# Patient Record
Sex: Male | Born: 1945 | Race: Black or African American | Hispanic: No | Marital: Married | Smoking: Never smoker
Health system: Southern US, Community
[De-identification: ages and names within clinical notes are randomized; demographics above are authoritative.]

## PROBLEM LIST (undated history)

## (undated) DIAGNOSIS — K1120 Sialoadenitis, unspecified: Secondary | ICD-10-CM

## (undated) DIAGNOSIS — R030 Elevated blood-pressure reading, without diagnosis of hypertension: Secondary | ICD-10-CM

## (undated) DIAGNOSIS — K2270 Barrett's esophagus without dysplasia: Secondary | ICD-10-CM

## (undated) DIAGNOSIS — I889 Nonspecific lymphadenitis, unspecified: Secondary | ICD-10-CM

## (undated) DIAGNOSIS — M129 Arthropathy, unspecified: Secondary | ICD-10-CM

## (undated) DIAGNOSIS — R718 Other abnormality of red blood cells: Secondary | ICD-10-CM

## (undated) DIAGNOSIS — E781 Pure hyperglyceridemia: Secondary | ICD-10-CM

## (undated) DIAGNOSIS — A048 Other specified bacterial intestinal infections: Secondary | ICD-10-CM

## (undated) DIAGNOSIS — I10 Essential (primary) hypertension: Secondary | ICD-10-CM

## (undated) DIAGNOSIS — M8580 Other specified disorders of bone density and structure, unspecified site: Secondary | ICD-10-CM

## (undated) DIAGNOSIS — R809 Proteinuria, unspecified: Secondary | ICD-10-CM

## (undated) DIAGNOSIS — R42 Dizziness and giddiness: Secondary | ICD-10-CM

## (undated) DIAGNOSIS — N183 Chronic kidney disease, stage 3 unspecified: Secondary | ICD-10-CM

## (undated) DIAGNOSIS — M1990 Unspecified osteoarthritis, unspecified site: Secondary | ICD-10-CM

## (undated) DIAGNOSIS — K317 Polyp of stomach and duodenum: Secondary | ICD-10-CM

## (undated) DIAGNOSIS — D649 Anemia, unspecified: Secondary | ICD-10-CM

## (undated) DIAGNOSIS — C61 Malignant neoplasm of prostate: Secondary | ICD-10-CM

## (undated) DIAGNOSIS — R822 Biliuria: Secondary | ICD-10-CM

## (undated) DIAGNOSIS — R972 Elevated prostate specific antigen [PSA]: Secondary | ICD-10-CM

## (undated) DIAGNOSIS — R011 Cardiac murmur, unspecified: Secondary | ICD-10-CM

## (undated) DIAGNOSIS — E611 Iron deficiency: Secondary | ICD-10-CM

## (undated) HISTORY — DX: Other specified disorders of bone density and structure, unspecified site: M85.80

## (undated) HISTORY — DX: Other abnormality of red blood cells: R71.8

## (undated) HISTORY — DX: Barrett's esophagus without dysplasia: K22.70

## (undated) HISTORY — DX: Iron deficiency: E61.1

## (undated) HISTORY — DX: Other specified bacterial intestinal infections: A04.8

## (undated) HISTORY — DX: Dizziness and giddiness: R42

## (undated) HISTORY — DX: Elevated blood-pressure reading, without diagnosis of hypertension: R03.0

## (undated) HISTORY — DX: Pure hyperglyceridemia: E78.1

## (undated) HISTORY — DX: Anemia, unspecified: D64.9

## (undated) HISTORY — PX: PROSTATE BIOPSY: SHX241

## (undated) HISTORY — DX: Sialoadenitis, unspecified: K11.20

## (undated) HISTORY — DX: Arthropathy, unspecified: M12.9

## (undated) HISTORY — DX: Nonspecific lymphadenitis, unspecified: I88.9

## (undated) HISTORY — DX: Polyp of stomach and duodenum: K31.7

## (undated) HISTORY — DX: Chronic kidney disease, stage 3 unspecified: N18.30

---

## 1998-07-06 HISTORY — PX: INGUINAL HERNIA REPAIR: SHX194

## 2007-05-01 ENCOUNTER — Emergency Department (HOSPITAL_COMMUNITY): Admission: EM | Admit: 2007-05-01 | Discharge: 2007-05-01 | Payer: Self-pay | Admitting: Emergency Medicine

## 2007-05-04 ENCOUNTER — Emergency Department (HOSPITAL_COMMUNITY): Admission: EM | Admit: 2007-05-04 | Discharge: 2007-05-04 | Payer: Self-pay | Admitting: Emergency Medicine

## 2018-06-22 ENCOUNTER — Ambulatory Visit: Payer: Self-pay | Admitting: Oncology

## 2018-07-05 ENCOUNTER — Encounter: Payer: Self-pay | Admitting: Radiation Oncology

## 2018-07-05 NOTE — Progress Notes (Addendum)
GU Location of Tumor / Histology: prostatic adenocarcinoma  If Prostate Cancer, Gleason Score is (3 + 3) and PSA is (18). Prostate volume: 50.6  Nathan Castro reports he was initially told his PSA was elevated three years ago. Patient requested Dr. Harlow Asa refer him to Dr. Tammi Klippel for treatment so he could be here with his wife who was also diagnosed with cancer in November. His wife is receiving treatment here at [facility].   Biopsies of prostate (if applicable) revealed:    Past/Anticipated interventions by urology, if any: prostate biopsy and referral for consideration of radiotherapy.  Past/Anticipated interventions by medical oncology, if any: no  Weight changes, if any: no  Bowel/Bladder complaints, if any: SHIM 5 without Viagra. IPSS 8. Denies dysuria, hematuria, urinary leakage or incontinence.    Nausea/Vomiting, if any: no  Pain issues, if any:  no  SAFETY ISSUES:  Prior radiation? no  Pacemaker/ICD? no  Possible current pregnancy? no  Is the patient on methotrexate? no  Current Complaints / other details:  72 year old male. Married. Self referral. Brother had prostate biopsy recently that was negative. Sister has breast ca. Another brother has colon ca.

## 2018-07-07 ENCOUNTER — Telehealth: Payer: Self-pay | Admitting: *Deleted

## 2018-07-07 ENCOUNTER — Ambulatory Visit
Admission: RE | Admit: 2018-07-07 | Discharge: 2018-07-07 | Disposition: A | Discharge status: Home / Self Care | Payer: Medicare Other | Source: Ambulatory Visit | Attending: Radiation Oncology | Admitting: Radiation Oncology

## 2018-07-07 ENCOUNTER — Encounter: Payer: Self-pay | Admitting: Radiation Oncology

## 2018-07-07 ENCOUNTER — Other Ambulatory Visit: Payer: Self-pay

## 2018-07-07 ENCOUNTER — Encounter: Payer: Self-pay | Admitting: Medical Oncology

## 2018-07-07 VITALS — BP 176/71 | HR 66 | Temp 97.7°F | Resp 18 | Ht 72.0 in | Wt 181.4 lb

## 2018-07-07 DIAGNOSIS — C61 Malignant neoplasm of prostate: Secondary | ICD-10-CM | POA: Insufficient documentation

## 2018-07-07 DIAGNOSIS — Z79899 Other long term (current) drug therapy: Secondary | ICD-10-CM | POA: Diagnosis not present

## 2018-07-07 DIAGNOSIS — I10 Essential (primary) hypertension: Secondary | ICD-10-CM | POA: Diagnosis not present

## 2018-07-07 DIAGNOSIS — M069 Rheumatoid arthritis, unspecified: Secondary | ICD-10-CM | POA: Diagnosis not present

## 2018-07-07 HISTORY — DX: Malignant neoplasm of prostate: C61

## 2018-07-07 HISTORY — DX: Essential (primary) hypertension: I10

## 2018-07-07 HISTORY — DX: Cardiac murmur, unspecified: R01.1

## 2018-07-07 HISTORY — DX: Biliuria: R82.2

## 2018-07-07 HISTORY — DX: Proteinuria, unspecified: R80.9

## 2018-07-07 HISTORY — DX: Unspecified osteoarthritis, unspecified site: M19.90

## 2018-07-07 HISTORY — DX: Elevated prostate specific antigen (PSA): R97.20

## 2018-07-07 NOTE — Progress Notes (Signed)
Introduced myself to Mr. Bogdanski as the prostate nurse navigator and my role. He states he was seen by urology in [facility] and he self referred himself to [facility] because his wife is being treated here for breast cancer. He had done a lot of research on prostate cancer treatments and is most interested in brachytherapy. He has a small volume of disease but a PSA of 18. He does not want to continue active surveillance. He is aware he will need a referral to [facility] for a urologist that will be able to assist Dr. Tammi Klippel with the seed implant. He was instructed to call us with questions or concerns.

## 2018-07-07 NOTE — Progress Notes (Signed)
See progress note under physician encounter. 

## 2018-07-07 NOTE — Progress Notes (Signed)
Radiation Oncology         [phone] ________________________________  Initial outpatient Consultation  Name: Nathan Castro MRN: [id number]  Date: 07/07/2018  DOB: 01/24/1946  WU:[id number], Lupita Raider, PA  Penni Bombard, PA   REFERRING PHYSICIAN: Penni Bombard, [place]  DIAGNOSIS: 73 y.o. gentleman with Stage T1c adenocarcinoma of the prostate with Gleason score of 3+3, and PSA of 18.    ICD-10-CM   1. Malignant neoplasm of prostate ([place]) C61     HISTORY OF PRESENT ILLNESS: Nathan Castro is a 73 y.o. male with a diagnosis of prostate cancer. He was noted to have an elevated PSA of 16.65 by his primary care physician's assistant, Hubbard Robinson.  Accordingly, he was referred for evaluation in urology by Dr. Burnell Blanks on 05/16/18,  digital rectal examination was performed at that time revealing no nodule.  The patient proceeded to transrectal ultrasound with 12 biopsies of the prostate on 05/31/18.  The prostate volume measured 50.6 cc.  Out of 12 core biopsies, one was positive.  The maximum Gleason score was 3+3, and this was seen in the right medial base.  The patient reviewed the biopsy results with his urologist and he has kindly been referred today for discussion of potential radiation treatment options.   PREVIOUS RADIATION THERAPY: No  PAST MEDICAL HISTORY:  Past Medical History:  Diagnosis Date  . Arthritis    rheumatoid arthritis  . Bilirubinuria   . Elevated PSA   . Heart murmur   . Hypertension   . Prostate cancer ([place])   . Proteinuria       PAST SURGICAL HISTORY: Past Surgical History:  Procedure Laterality Date  . INGUINAL HERNIA REPAIR  2000  . PROSTATE BIOPSY      FAMILY HISTORY:  Family History  Problem Relation Age of Onset  . Breast cancer Sister   . Colon cancer Brother   . Prostate cancer Neg Hx   . Pancreatic cancer Neg Hx     SOCIAL HISTORY:  Social History   Socioeconomic History  . Marital status: Married    Spouse name: Not on file  .  Number of children: 2  . Years of education: Not on file  . Highest education level: Not on file  Occupational History    Comment: retired  Scientific laboratory technician  . Financial resource strain: Not on file  . Food insecurity:    Worry: Not on file    Inability: Not on file  . Transportation needs:    Medical: Not on file    Non-medical: Not on file  Tobacco Use  . Smoking status: Never Smoker  . Smokeless tobacco: Never Used  Substance and Sexual Activity  . Alcohol use: Never    Frequency: Never  . Drug use: Never  . Sexual activity: Yes  Lifestyle  . Physical activity:    Days per week: Not on file    Minutes per session: Not on file  . Stress: Not on file  Relationships  . Social connections:    Talks on phone: Not on file    Gets together: Not on file    Attends religious service: Not on file    Active member of club or organization: Not on file    Attends meetings of clubs or organizations: Not on file    Relationship status: Not on file  . Intimate partner violence:    Fear of current or ex partner: Not on file    Emotionally abused: Not on file  Physically abused: Not on file    Forced sexual activity: Not on file  Other Topics Concern  . Not on file  Social History Narrative   Resides in [place] with wife.     ALLERGIES: Patient has no known allergies.  MEDICATIONS:  Current Outpatient Medications  Medication Sig Dispense Refill  . amLODipine-benazepril (LOTREL) 10-40 MG capsule Take 1 capsule by mouth daily.    . Cholecalciferol (VITAMIN D3) 1.25 MG (50000 UT) CAPS Take by mouth.    . ferrous sulfate 325 (65 FE) MG tablet Take 325 mg by mouth daily with breakfast.    . Garlic 4742 MG CAPS Take by mouth.    . Omega-3 Fatty Acids (FISH OIL) 1000 MG CAPS Take by mouth.    . sildenafil (VIAGRA) 100 MG tablet Take 100 mg by mouth daily as needed for erectile dysfunction.     No current facility-administered medications for this encounter.     REVIEW OF  SYSTEMS:  On review of systems, the patient reports that he is doing well overall. He denies any chest pain, shortness of breath, cough, fevers, chills, night sweats, unintended weight changes. He denies any bowel disturbances, and denies abdominal pain, nausea or vomiting. He denies any new musculoskeletal or joint aches or pains. His IPSS was 8, indicating mild urinary symptoms.  A complete review of systems is obtained and is otherwise negative.    PHYSICAL EXAM:  Wt Readings from Last 3 Encounters:  07/07/18 181 lb 6.4 oz (82.3 kg)   Temp Readings from Last 3 Encounters:  07/07/18 97.7 F (36.5 C) (Oral)   BP Readings from Last 3 Encounters:  07/07/18 (!) 176/71   Pulse Readings from Last 3 Encounters:  07/07/18 66   Pain Assessment Pain Score: 0-No pain/10  In general this is a well appearing man in no acute distress. He is alert and oriented x4 and appropriate throughout the examination. HEENT reveals that the patient is normocephalic, atraumatic. EOMs are intact. PERRLA. Skin is intact without any evidence of gross lesions. Cardiovascular exam reveals a regular rate and rhythm, no clicks rubs or murmurs are auscultated. Chest is clear to auscultation bilaterally. Lymphatic assessment is performed and does not reveal any adenopathy in the cervical, supraclavicular, axillary, or inguinal chains. Abdomen has active bowel sounds in all quadrants and is intact. The abdomen is soft, non tender, non distended. Lower extremities are negative for pretibial pitting edema, deep calf tenderness, cyanosis or clubbing.   KPS = 100  100 - Normal; no complaints; no evidence of disease. 90   - Able to carry on normal activity; minor signs or symptoms of disease. 80   - Normal activity with effort; some signs or symptoms of disease. 10   - Cares for self; unable to carry on normal activity or to do active work. 60   - Requires occasional assistance, but is able to care for most of his personal  needs. 50   - Requires considerable assistance and frequent medical care. 60   - Disabled; requires special care and assistance. 13   - Severely disabled; hospital admission is indicated although death not imminent. 77   - Very sick; hospital admission necessary; active supportive treatment necessary. 10   - Moribund; fatal processes progressing rapidly. 0     - Dead  Karnofsky DA, Abelmann WH, Craver LS and Burchenal [facility] [phone]) The use of the nitrogen mustards in the palliative treatment of carcinoma: with particular reference to bronchogenic carcinoma Cancer 1 634-56  LABORATORY DATA:  No results found for: WBC, HGB, HCT, MCV, PLT No results found for: NA, K, CL, CO2 No results found for: ALT, AST, GGT, ALKPHOS, BILITOT   RADIOGRAPHY: No results found.    IMPRESSION/PLAN: 1. 73 y.o. gentleman with Stage T1c adenocarcinoma of the prostate with Gleason Score of 3+3, and PSA of 18. We discussed the patient's workup and outlines the nature of prostate cancer in this setting. The patient's T stage, Gleason's score, and PSA put him into the intermediate risk group. Accordingly, he is eligible for a variety of potential treatment options including brachytherapy, 5.5 weeks of external radiation or 5 weeks of external radiation followed by a brachytherapy boost. We discussed the available radiation techniques, and focused on the details and logistics and delivery.  We discussed and outlined the risks, benefits, short and long-term effects associated with radiotherapy and compared and contrasted these with prostatectomy. We discussed the role of SpaceOAR in reducing the rectal toxicity associated with radiotherapy.   At the end of the conversation the patient is interested in moving forward with prostate brachytherapy.  In order to consider brachytherapy, we will refer the patient to [facility] for consultation.  Of course, if they decide on another option, such as prostatectomy or surveillance,  I am supportive of other recommendations based on their expertise.  With a PSA of 18, we also discussed staging studies like bone scan and or CT scan.  The small volume of Gleason 6 disease would not otherwise warrant additional staging.  I am also interested in [facility] opinion about interpreting these somewhat disparate clinical characteristics.  We spent 60 minutes face to face with the patient and more than 50% of that time was spent in counseling and/or coordination of care.    Nicholos Johns, PA-C    Tyler Pita, MD  [facility] Direct Dial: [phone]  Fax: [phone] [email or web address]  Skype  LinkedIn

## 2018-07-07 NOTE — Telephone Encounter (Signed)
Called patient to inform of [place] appt. with Dr. Alinda Money on 07-19-18 - arrival time - 3:45 pm, lvm for a return call

## 2018-07-14 ENCOUNTER — Encounter: Payer: Self-pay | Admitting: General Practice

## 2018-07-14 NOTE — Progress Notes (Signed)
[place] Psychosocial Distress Screening Clinical Social Work  Clinical Social Work was referred by distress screening protocol.  The patient scored a 5 on the Psychosocial Distress Thermometer which indicates moderate distress. Clinical Social Worker contacted patient by phone to assess for distress and other psychosocial needs. Unable to reach patient at home or mobile number.  Left VM w information on [place], contact information and encouragement to call as needed for support/resources.  ONCBCN DISTRESS SCREENING 07/07/2018  Screening Type Initial Screening  Distress experienced in past week (1-10) 5  Practical problem type Housing  Family Problem type Partner  Emotional problem type Adjusting to illness  Information Concerns Type Lack of info about treatment  Physical Problem type Sexual problems;Skin dry/itchy  Physician notified of physical symptoms Yes  Referral to clinical psychology No  Referral to clinical social work Yes  Referral to dietition No  Referral to financial advocate No  Referral to support programs No  Referral to palliative care No  Other call any time i am retired    Holiday representative follow up needed: No.  If yes, follow up plan:  Beverely Pace, [place], LCSW Clinical Social Worker Phone:  [phone]

## 2018-07-18 ENCOUNTER — Telehealth: Payer: Self-pay | Admitting: Radiation Oncology

## 2018-07-18 NOTE — Telephone Encounter (Signed)
Phoned patient's cell and home phone to further inquire about inbasket sent. No answer at either number. Able to left message only on home phone. Awaiting return call.

## 2018-07-18 NOTE — Telephone Encounter (Signed)
-----   Message from Gardiner Rhyme, RN sent at 07/18/2018 10:03 AM EST ----- Regarding: Patient needs to clarify his treatment plan Jen Mow, I spoke to Mr. Gloria in infusion today.  He does not want a seed implant after discussing with family and he wants external radiation only.  He has an appointment today with urology that he wants to see if he can cancel.  He also wants to know how soon he can be scheduled for external radiation as well.  His phone number at home is [phone] and his cell is [phone]. Thanks! Threasa Beards

## 2018-08-05 ENCOUNTER — Other Ambulatory Visit: Payer: Self-pay | Admitting: Urology

## 2018-08-05 DIAGNOSIS — C61 Malignant neoplasm of prostate: Secondary | ICD-10-CM

## 2018-08-08 ENCOUNTER — Telehealth: Payer: Self-pay | Admitting: *Deleted

## 2018-08-08 NOTE — Telephone Encounter (Signed)
Called patient to inform of sim appt. for 08-19-18 - arrival time- 1:45 pm @ [facility] and his MRI on 08-19-18 - arrival time - 3:30 pm @ [facility], no restrictions to test , spoke with patient and he is aware of these appts.

## 2018-08-11 ENCOUNTER — Encounter: Payer: Self-pay | Admitting: Urology

## 2018-08-11 NOTE — Progress Notes (Signed)
Patient is scheduled for fiducial markers and SpaceOAR gel placement in the office with Dr. Alinda Money 08/16/18.  CT SIM is scheduled for 08/19/18.

## 2018-08-18 ENCOUNTER — Telehealth: Payer: Self-pay | Admitting: *Deleted

## 2018-08-18 NOTE — Telephone Encounter (Signed)
CALLED PATIENT TO REMIND OF SIM APPT. FOR 08-19-18 @ 2 PM @ [facility] AND HIS MRI FOR 08-19-18 - ARRIVAL TIME- 3:30 PM @ [facility], NO RESTRICTIONS TO TEST, SPOKE WITH PATIENT AND HE IS AWARE OF THESE APPTS.

## 2018-08-19 ENCOUNTER — Ambulatory Visit
Admission: RE | Admit: 2018-08-19 | Discharge: 2018-08-19 | Disposition: A | Discharge status: Home / Self Care | Payer: Medicare Other | Source: Ambulatory Visit | Attending: Radiation Oncology | Admitting: Radiation Oncology

## 2018-08-19 ENCOUNTER — Ambulatory Visit (HOSPITAL_COMMUNITY)
Admission: RE | Admit: 2018-08-19 | Discharge: 2018-08-19 | Disposition: A | Discharge status: Home / Self Care | Payer: Medicare Other | Source: Ambulatory Visit | Attending: Urology | Admitting: Urology

## 2018-08-19 ENCOUNTER — Encounter: Payer: Self-pay | Admitting: Medical Oncology

## 2018-08-19 DIAGNOSIS — C61 Malignant neoplasm of prostate: Secondary | ICD-10-CM | POA: Insufficient documentation

## 2018-08-19 DIAGNOSIS — Z79899 Other long term (current) drug therapy: Secondary | ICD-10-CM | POA: Insufficient documentation

## 2018-08-19 DIAGNOSIS — I10 Essential (primary) hypertension: Secondary | ICD-10-CM | POA: Diagnosis not present

## 2018-08-19 DIAGNOSIS — M069 Rheumatoid arthritis, unspecified: Secondary | ICD-10-CM | POA: Insufficient documentation

## 2018-08-19 NOTE — Progress Notes (Signed)
  Radiation Oncology         [phone] ________________________________  Name: Nathan Castro MRN: [id number]  Date: 08/19/2018  DOB: 21-May-1946  SIMULATION AND TREATMENT PLANNING NOTE    ICD-10-CM   1. Malignant neoplasm of prostate ([place]) C61     DIAGNOSIS:  73 y.o. gentleman with Stage T1c adenocarcinoma of the prostate with Gleason score of 3+3, and PSA of 18.  NARRATIVE:  The patient was brought to the [facility].  Identity was confirmed.  All relevant records and images related to the planned course of therapy were reviewed.  The patient freely provided informed written consent to proceed with treatment after reviewing the details related to the planned course of therapy. The consent form was witnessed and verified by the simulation staff.  Then, the patient was set-up in a stable reproducible supine position for radiation therapy.  A vacuum lock pillow device was custom fabricated to position his legs in a reproducible immobilized position.  Then, I performed a urethrogram under sterile conditions to identify the prostatic apex.  CT images were obtained.  Surface markings were placed.  The CT images were loaded into the planning software.  Then the prostate target and avoidance structures including the rectum, bladder, bowel and hips were contoured.  Treatment planning then occurred.  The radiation prescription was entered and confirmed.  A total of one complex treatment devices was fabricated. I have requested : Intensity Modulated Radiotherapy (IMRT) is medically necessary for this case for the following reason:  Rectal sparing.  PLAN:  The patient will receive 70 Gy in 28 fractions.  ________________________________  Sheral Apley Tammi Klippel, M.D.  This document serves as a record of services personally performed by Tyler Pita, MD. It was created on his behalf by Wilburn Mylar, a trained medical scribe. The creation of this record is based on the scribe's personal  observations and the provider's statements to them. This document has been checked and approved by the attending provider.

## 2018-08-26 DIAGNOSIS — C61 Malignant neoplasm of prostate: Secondary | ICD-10-CM | POA: Diagnosis not present

## 2018-08-31 ENCOUNTER — Ambulatory Visit
Admission: RE | Admit: 2018-08-31 | Discharge: 2018-08-31 | Disposition: A | Discharge status: Home / Self Care | Payer: Medicare Other | Source: Ambulatory Visit | Attending: Radiation Oncology | Admitting: Radiation Oncology

## 2018-08-31 DIAGNOSIS — C61 Malignant neoplasm of prostate: Secondary | ICD-10-CM

## 2018-09-01 ENCOUNTER — Ambulatory Visit
Admission: RE | Admit: 2018-09-01 | Discharge: 2018-09-01 | Disposition: A | Discharge status: Home / Self Care | Payer: Medicare Other | Source: Ambulatory Visit | Attending: Radiation Oncology | Admitting: Radiation Oncology

## 2018-09-01 DIAGNOSIS — C61 Malignant neoplasm of prostate: Secondary | ICD-10-CM | POA: Diagnosis not present

## 2018-09-02 ENCOUNTER — Ambulatory Visit
Admission: RE | Admit: 2018-09-02 | Discharge: 2018-09-02 | Disposition: A | Discharge status: Home / Self Care | Payer: Medicare Other | Source: Ambulatory Visit | Attending: Radiation Oncology | Admitting: Radiation Oncology

## 2018-09-02 DIAGNOSIS — C61 Malignant neoplasm of prostate: Secondary | ICD-10-CM | POA: Diagnosis not present

## 2018-09-05 ENCOUNTER — Ambulatory Visit
Admission: RE | Admit: 2018-09-05 | Discharge: 2018-09-05 | Disposition: A | Discharge status: Home / Self Care | Payer: Medicare Other | Source: Ambulatory Visit | Attending: Radiation Oncology | Admitting: Radiation Oncology

## 2018-09-05 DIAGNOSIS — Z79899 Other long term (current) drug therapy: Secondary | ICD-10-CM | POA: Insufficient documentation

## 2018-09-05 DIAGNOSIS — M069 Rheumatoid arthritis, unspecified: Secondary | ICD-10-CM | POA: Insufficient documentation

## 2018-09-05 DIAGNOSIS — C61 Malignant neoplasm of prostate: Secondary | ICD-10-CM | POA: Insufficient documentation

## 2018-09-05 DIAGNOSIS — I10 Essential (primary) hypertension: Secondary | ICD-10-CM | POA: Diagnosis not present

## 2018-09-06 ENCOUNTER — Ambulatory Visit
Admission: RE | Admit: 2018-09-06 | Discharge: 2018-09-06 | Disposition: A | Discharge status: Home / Self Care | Payer: Medicare Other | Source: Ambulatory Visit | Attending: Radiation Oncology | Admitting: Radiation Oncology

## 2018-09-06 DIAGNOSIS — C61 Malignant neoplasm of prostate: Secondary | ICD-10-CM | POA: Diagnosis not present

## 2018-09-07 ENCOUNTER — Ambulatory Visit
Admission: RE | Admit: 2018-09-07 | Discharge: 2018-09-07 | Disposition: A | Discharge status: Home / Self Care | Payer: Medicare Other | Source: Ambulatory Visit | Attending: Radiation Oncology | Admitting: Radiation Oncology

## 2018-09-07 DIAGNOSIS — C61 Malignant neoplasm of prostate: Secondary | ICD-10-CM | POA: Diagnosis not present

## 2018-09-08 ENCOUNTER — Ambulatory Visit
Admission: RE | Admit: 2018-09-08 | Discharge: 2018-09-08 | Disposition: A | Discharge status: Home / Self Care | Payer: Medicare Other | Source: Ambulatory Visit | Attending: Radiation Oncology | Admitting: Radiation Oncology

## 2018-09-08 DIAGNOSIS — C61 Malignant neoplasm of prostate: Secondary | ICD-10-CM | POA: Diagnosis not present

## 2018-09-09 ENCOUNTER — Ambulatory Visit
Admission: RE | Admit: 2018-09-09 | Discharge: 2018-09-09 | Disposition: A | Discharge status: Home / Self Care | Payer: Medicare Other | Source: Ambulatory Visit | Attending: Radiation Oncology | Admitting: Radiation Oncology

## 2018-09-09 DIAGNOSIS — C61 Malignant neoplasm of prostate: Secondary | ICD-10-CM | POA: Diagnosis not present

## 2018-09-12 ENCOUNTER — Ambulatory Visit
Admission: RE | Admit: 2018-09-12 | Discharge: 2018-09-12 | Disposition: A | Discharge status: Home / Self Care | Payer: Medicare Other | Source: Ambulatory Visit | Attending: Radiation Oncology | Admitting: Radiation Oncology

## 2018-09-12 DIAGNOSIS — C61 Malignant neoplasm of prostate: Secondary | ICD-10-CM | POA: Diagnosis not present

## 2018-09-13 ENCOUNTER — Ambulatory Visit
Admission: RE | Admit: 2018-09-13 | Discharge: 2018-09-13 | Disposition: A | Discharge status: Home / Self Care | Payer: Medicare Other | Source: Ambulatory Visit | Attending: Radiation Oncology | Admitting: Radiation Oncology

## 2018-09-13 DIAGNOSIS — C61 Malignant neoplasm of prostate: Secondary | ICD-10-CM | POA: Diagnosis not present

## 2018-09-14 ENCOUNTER — Ambulatory Visit
Admission: RE | Admit: 2018-09-14 | Discharge: 2018-09-14 | Disposition: A | Discharge status: Home / Self Care | Payer: Medicare Other | Source: Ambulatory Visit | Attending: Radiation Oncology | Admitting: Radiation Oncology

## 2018-09-14 ENCOUNTER — Other Ambulatory Visit: Payer: Self-pay

## 2018-09-14 DIAGNOSIS — C61 Malignant neoplasm of prostate: Secondary | ICD-10-CM | POA: Diagnosis not present

## 2018-09-15 ENCOUNTER — Ambulatory Visit
Admission: RE | Admit: 2018-09-15 | Discharge: 2018-09-15 | Disposition: A | Discharge status: Home / Self Care | Payer: Medicare Other | Source: Ambulatory Visit | Attending: Radiation Oncology | Admitting: Radiation Oncology

## 2018-09-15 ENCOUNTER — Other Ambulatory Visit: Payer: Self-pay

## 2018-09-15 DIAGNOSIS — C61 Malignant neoplasm of prostate: Secondary | ICD-10-CM | POA: Diagnosis not present

## 2018-09-16 ENCOUNTER — Other Ambulatory Visit: Payer: Self-pay

## 2018-09-16 ENCOUNTER — Ambulatory Visit
Admission: RE | Admit: 2018-09-16 | Discharge: 2018-09-16 | Disposition: A | Discharge status: Home / Self Care | Payer: Medicare Other | Source: Ambulatory Visit | Attending: Radiation Oncology | Admitting: Radiation Oncology

## 2018-09-16 DIAGNOSIS — C61 Malignant neoplasm of prostate: Secondary | ICD-10-CM | POA: Diagnosis not present

## 2018-09-19 ENCOUNTER — Ambulatory Visit
Admission: RE | Admit: 2018-09-19 | Discharge: 2018-09-19 | Disposition: A | Discharge status: Home / Self Care | Payer: Medicare Other | Source: Ambulatory Visit | Attending: Radiation Oncology | Admitting: Radiation Oncology

## 2018-09-19 ENCOUNTER — Other Ambulatory Visit: Payer: Self-pay

## 2018-09-19 DIAGNOSIS — C61 Malignant neoplasm of prostate: Secondary | ICD-10-CM | POA: Diagnosis not present

## 2018-09-20 ENCOUNTER — Ambulatory Visit
Admission: RE | Admit: 2018-09-20 | Discharge: 2018-09-20 | Disposition: A | Discharge status: Home / Self Care | Payer: Medicare Other | Source: Ambulatory Visit | Attending: Radiation Oncology | Admitting: Radiation Oncology

## 2018-09-20 ENCOUNTER — Other Ambulatory Visit: Payer: Self-pay

## 2018-09-20 DIAGNOSIS — C61 Malignant neoplasm of prostate: Secondary | ICD-10-CM | POA: Diagnosis not present

## 2018-09-21 ENCOUNTER — Other Ambulatory Visit: Payer: Self-pay

## 2018-09-21 ENCOUNTER — Ambulatory Visit
Admission: RE | Admit: 2018-09-21 | Discharge: 2018-09-21 | Disposition: A | Discharge status: Home / Self Care | Payer: Medicare Other | Source: Ambulatory Visit | Attending: Radiation Oncology | Admitting: Radiation Oncology

## 2018-09-21 DIAGNOSIS — C61 Malignant neoplasm of prostate: Secondary | ICD-10-CM | POA: Diagnosis not present

## 2018-09-22 ENCOUNTER — Other Ambulatory Visit: Payer: Self-pay

## 2018-09-22 ENCOUNTER — Ambulatory Visit
Admission: RE | Admit: 2018-09-22 | Discharge: 2018-09-22 | Disposition: A | Discharge status: Home / Self Care | Payer: Medicare Other | Source: Ambulatory Visit | Attending: Radiation Oncology | Admitting: Radiation Oncology

## 2018-09-22 DIAGNOSIS — C61 Malignant neoplasm of prostate: Secondary | ICD-10-CM | POA: Diagnosis not present

## 2018-09-23 ENCOUNTER — Other Ambulatory Visit: Payer: Self-pay | Admitting: Radiation Oncology

## 2018-09-23 ENCOUNTER — Other Ambulatory Visit: Payer: Self-pay

## 2018-09-23 ENCOUNTER — Ambulatory Visit
Admission: RE | Admit: 2018-09-23 | Discharge: 2018-09-23 | Disposition: A | Discharge status: Home / Self Care | Payer: Medicare Other | Source: Ambulatory Visit | Attending: Radiation Oncology | Admitting: Radiation Oncology

## 2018-09-23 DIAGNOSIS — C61 Malignant neoplasm of prostate: Secondary | ICD-10-CM | POA: Diagnosis not present

## 2018-09-23 MED ORDER — TAMSULOSIN HCL 0.4 MG PO CAPS: 0.4000 mg | ORAL_CAPSULE | Freq: Every day | ORAL | 0 refills | Status: DC

## 2018-09-23 MED ORDER — PHENAZOPYRIDINE HCL 100 MG PO TABS: 100.0000 mg | ORAL_TABLET | Freq: Three times a day (TID) | ORAL | 0 refills | Status: DC | PRN

## 2018-09-26 ENCOUNTER — Other Ambulatory Visit: Payer: Self-pay

## 2018-09-26 ENCOUNTER — Ambulatory Visit
Admission: RE | Admit: 2018-09-26 | Discharge: 2018-09-26 | Disposition: A | Discharge status: Home / Self Care | Payer: Medicare Other | Source: Ambulatory Visit | Attending: Radiation Oncology | Admitting: Radiation Oncology

## 2018-09-26 DIAGNOSIS — C61 Malignant neoplasm of prostate: Secondary | ICD-10-CM | POA: Diagnosis not present

## 2018-09-27 ENCOUNTER — Other Ambulatory Visit: Payer: Self-pay

## 2018-09-27 ENCOUNTER — Ambulatory Visit
Admission: RE | Admit: 2018-09-27 | Discharge: 2018-09-27 | Disposition: A | Discharge status: Home / Self Care | Payer: Medicare Other | Source: Ambulatory Visit | Attending: Radiation Oncology | Admitting: Radiation Oncology

## 2018-09-27 DIAGNOSIS — C61 Malignant neoplasm of prostate: Secondary | ICD-10-CM | POA: Diagnosis not present

## 2018-09-28 ENCOUNTER — Ambulatory Visit
Admission: RE | Admit: 2018-09-28 | Discharge: 2018-09-28 | Disposition: A | Discharge status: Home / Self Care | Payer: Medicare Other | Source: Ambulatory Visit | Attending: Radiation Oncology | Admitting: Radiation Oncology

## 2018-09-28 ENCOUNTER — Other Ambulatory Visit: Payer: Self-pay

## 2018-09-28 DIAGNOSIS — C61 Malignant neoplasm of prostate: Secondary | ICD-10-CM | POA: Diagnosis not present

## 2018-09-29 ENCOUNTER — Ambulatory Visit
Admission: RE | Admit: 2018-09-29 | Discharge: 2018-09-29 | Disposition: A | Discharge status: Home / Self Care | Payer: Medicare Other | Source: Ambulatory Visit | Attending: Radiation Oncology | Admitting: Radiation Oncology

## 2018-09-29 ENCOUNTER — Other Ambulatory Visit: Payer: Self-pay

## 2018-09-29 DIAGNOSIS — C61 Malignant neoplasm of prostate: Secondary | ICD-10-CM | POA: Diagnosis not present

## 2018-09-30 ENCOUNTER — Other Ambulatory Visit: Payer: Self-pay

## 2018-09-30 ENCOUNTER — Ambulatory Visit
Admission: RE | Admit: 2018-09-30 | Discharge: 2018-09-30 | Disposition: A | Discharge status: Home / Self Care | Payer: Medicare Other | Source: Ambulatory Visit | Attending: Radiation Oncology | Admitting: Radiation Oncology

## 2018-09-30 ENCOUNTER — Other Ambulatory Visit: Payer: Self-pay | Admitting: Radiation Oncology

## 2018-09-30 DIAGNOSIS — C61 Malignant neoplasm of prostate: Secondary | ICD-10-CM | POA: Diagnosis not present

## 2018-09-30 MED ORDER — PHENAZOPYRIDINE HCL 100 MG PO TABS: 100.0000 mg | ORAL_TABLET | Freq: Three times a day (TID) | ORAL | 0 refills | Status: DC | PRN

## 2018-10-03 ENCOUNTER — Ambulatory Visit
Admission: RE | Admit: 2018-10-03 | Discharge: 2018-10-03 | Disposition: A | Discharge status: Home / Self Care | Payer: Medicare Other | Source: Ambulatory Visit | Attending: Radiation Oncology | Admitting: Radiation Oncology

## 2018-10-03 ENCOUNTER — Other Ambulatory Visit: Payer: Self-pay

## 2018-10-03 DIAGNOSIS — C61 Malignant neoplasm of prostate: Secondary | ICD-10-CM | POA: Diagnosis not present

## 2018-10-04 ENCOUNTER — Ambulatory Visit
Admission: RE | Admit: 2018-10-04 | Discharge: 2018-10-04 | Disposition: A | Discharge status: Home / Self Care | Payer: Medicare Other | Source: Ambulatory Visit | Attending: Radiation Oncology | Admitting: Radiation Oncology

## 2018-10-04 ENCOUNTER — Other Ambulatory Visit: Payer: Self-pay

## 2018-10-04 DIAGNOSIS — C61 Malignant neoplasm of prostate: Secondary | ICD-10-CM | POA: Diagnosis not present

## 2018-10-05 ENCOUNTER — Other Ambulatory Visit: Payer: Self-pay

## 2018-10-05 ENCOUNTER — Ambulatory Visit
Admission: RE | Admit: 2018-10-05 | Discharge: 2018-10-05 | Disposition: A | Discharge status: Home / Self Care | Payer: Medicare Other | Source: Ambulatory Visit | Attending: Radiation Oncology | Admitting: Radiation Oncology

## 2018-10-05 DIAGNOSIS — I10 Essential (primary) hypertension: Secondary | ICD-10-CM | POA: Diagnosis not present

## 2018-10-05 DIAGNOSIS — M069 Rheumatoid arthritis, unspecified: Secondary | ICD-10-CM | POA: Diagnosis not present

## 2018-10-05 DIAGNOSIS — Z79899 Other long term (current) drug therapy: Secondary | ICD-10-CM | POA: Diagnosis not present

## 2018-10-05 DIAGNOSIS — C61 Malignant neoplasm of prostate: Secondary | ICD-10-CM | POA: Insufficient documentation

## 2018-10-06 ENCOUNTER — Other Ambulatory Visit: Payer: Self-pay

## 2018-10-06 ENCOUNTER — Ambulatory Visit
Admission: RE | Admit: 2018-10-06 | Discharge: 2018-10-06 | Disposition: A | Discharge status: Home / Self Care | Payer: Medicare Other | Source: Ambulatory Visit | Attending: Radiation Oncology | Admitting: Radiation Oncology

## 2018-10-06 DIAGNOSIS — C61 Malignant neoplasm of prostate: Secondary | ICD-10-CM | POA: Diagnosis not present

## 2018-10-07 ENCOUNTER — Encounter: Payer: Self-pay | Admitting: Medical Oncology

## 2018-10-07 ENCOUNTER — Other Ambulatory Visit: Payer: Self-pay | Admitting: Radiation Oncology

## 2018-10-07 ENCOUNTER — Ambulatory Visit
Admission: RE | Admit: 2018-10-07 | Discharge: 2018-10-07 | Disposition: A | Discharge status: Home / Self Care | Payer: Medicare Other | Source: Ambulatory Visit | Attending: Radiation Oncology | Admitting: Radiation Oncology

## 2018-10-07 ENCOUNTER — Other Ambulatory Visit: Payer: Self-pay

## 2018-10-07 DIAGNOSIS — C61 Malignant neoplasm of prostate: Secondary | ICD-10-CM | POA: Diagnosis not present

## 2018-10-07 MED ORDER — AMLODIPINE BESY-BENAZEPRIL HCL 10-40 MG PO CAPS: 1.0000 | ORAL_CAPSULE | Freq: Every day | ORAL | 5 refills | Status: DC

## 2018-10-14 ENCOUNTER — Encounter: Payer: Self-pay | Admitting: Radiation Oncology

## 2018-10-14 NOTE — Progress Notes (Signed)
  Radiation Oncology         [phone] ________________________________  Name: Nathan Castro MRN: [id number]  Date: 10/14/2018  DOB: 1945/11/24  End of Treatment Note  Diagnosis:   73 y.o. gentleman with Stage T1c adenocarcinoma of the prostate with Gleason score of 3+3, and PSA of 18.     Indication for treatment:  Curative, Definitive Radiotherapy       Radiation treatment dates:   08/31/2018 - 10/07/2018  Site/dose:   The prostate was treated to 70 Gy in 28 fractions of 2.5 Gy  Beams/energy:   The patient was treated with IMRT using volumetric arc therapy delivering 6 MV X-rays to clockwise and counterclockwise circumferential arcs with a 90 degree collimator offset to avoid dose scalloping.  Image guidance was performed with daily cone beam CT prior to each fraction to align to gold markers in the prostate and assure proper bladder and rectal fill volumes.  Immobilization was achieved with BodyFix custom mold.  Narrative: The patient tolerated radiation treatment relatively well. He reported an increase in nocturia from x2 to x5, an increase in urgency and leakage, and weakening of his stream. He experienced a week of mild dysuria and hesitancy which spontaneously resolved prior to completion of treatment. He denied hematuria, fatigue, and any bowel complaints throughout treatment.   Plan: The patient has completed radiation treatment. He will return to radiation oncology clinic for routine followup in one month. I advised him to call or return sooner if he has any questions or concerns related to his recovery or treatment. ________________________________  Sheral Apley. Tammi Klippel, M.D.  This document serves as a record of services personally performed by Tyler Pita, MD. It was created on his behalf by Wilburn Mylar, a trained medical scribe. The creation of this record is based on the scribe's personal observations and the provider's statements to them. This document has been checked  and approved by the attending provider.

## 2018-10-25 NOTE — Progress Notes (Signed)
After consult with Dr. Tammi Klippel, Nathan Castro decided on external beam instead of brachytherapy. Here today for CT simulation.

## 2019-04-11 ENCOUNTER — Telehealth: Payer: Self-pay | Admitting: Radiation Oncology

## 2019-04-11 NOTE — Telephone Encounter (Signed)
Received PRESCRIPTION REFILL REQUEST form for amlodipine-benazpril. Returned fax denying refill and requesting patient seek refill from PCP. Patient completed radiation over six months ago. Fax confirmation of delivery obtained.

## 2019-04-26 ENCOUNTER — Telehealth: Payer: Self-pay | Admitting: Radiation Oncology

## 2019-04-26 NOTE — Telephone Encounter (Signed)
Received PRESCRIPTION REFILL REQUEST form for amlodipine-benazpril. Returned fax denying refill and requesting patient seek refill from PCP. Patient completed radiation over six months ago. Fax confirmation of delivery obtained.

## 2019-05-23 ENCOUNTER — Telehealth: Payer: Self-pay | Admitting: Radiation Oncology

## 2019-05-23 NOTE — Telephone Encounter (Signed)
Received a voicemail message from the patient inquiring about amlodipine refill. Phoned patient back. Explained I had received two request to refill from [facility] in [facility]. Explained both refill request were denied with encouragement to seek refill from PCP. Patient verbalized understanding and expressed appreciation for the return call.

## 2019-07-27 ENCOUNTER — Ambulatory Visit: Payer: Medicare Other | Attending: Internal Medicine

## 2019-07-27 ENCOUNTER — Ambulatory Visit: Payer: Medicare Other

## 2019-07-27 DIAGNOSIS — Z23 Encounter for immunization: Secondary | ICD-10-CM | POA: Insufficient documentation

## 2019-07-27 NOTE — Progress Notes (Signed)
   Covid-19 Vaccination Clinic  Name:  Nathan Castro    MRN: [id number] DOB: February 08, 1946  07/27/2019  Nathan Castro was observed post Covid-19 immunization for 15 minutes without incidence. He was provided with Vaccine Information Sheet and instruction to access the V-Safe system.   Nathan Castro was instructed to call 911 with any severe reactions post vaccine: Marland Kitchen Difficulty breathing  . Swelling of your face and throat  . A fast heartbeat  . A bad rash all over your body  . Dizziness and weakness    Immunizations Administered    Name Date Dose VIS Date Route   Pfizer COVID-19 Vaccine 07/27/2019  2:33 PM 0.3 mL 06/16/2019 Intramuscular   Manufacturer: [place]   Lot: [id number]   [place]: [id number]

## 2019-08-17 ENCOUNTER — Ambulatory Visit: Payer: Medicare Other | Attending: Internal Medicine

## 2019-08-17 DIAGNOSIS — Z23 Encounter for immunization: Secondary | ICD-10-CM

## 2019-08-17 NOTE — Progress Notes (Signed)
   Covid-19 Vaccination Clinic  Name:  Nathan Castro    MRN: [id number] DOB: July 01, 1946  08/17/2019  Nathan Castro was observed post Covid-19 immunization for 15 minutes without incidence. He was provided with Vaccine Information Sheet and instruction to access the V-Safe system.   Nathan Castro was instructed to call 911 with any severe reactions post vaccine: Marland Kitchen Difficulty breathing  . Swelling of your face and throat  . A fast heartbeat  . A bad rash all over your body  . Dizziness and weakness    Immunizations Administered    Name Date Dose VIS Date Route   Pfizer COVID-19 Vaccine 08/17/2019  3:41 PM 0.3 mL 06/16/2019 Intramuscular   Manufacturer: [employer], [employer]   Lot: [id number]   [place]: [id number]

## 2020-06-21 ENCOUNTER — Inpatient Hospital Stay: Payer: Medicare Other | Attending: Oncology

## 2020-06-21 ENCOUNTER — Other Ambulatory Visit: Payer: Self-pay

## 2020-06-21 DIAGNOSIS — C61 Malignant neoplasm of prostate: Secondary | ICD-10-CM | POA: Diagnosis present

## 2020-06-21 DIAGNOSIS — Z23 Encounter for immunization: Secondary | ICD-10-CM | POA: Insufficient documentation

## 2021-05-22 ENCOUNTER — Other Ambulatory Visit: Payer: Self-pay | Admitting: Nephrology

## 2021-05-22 DIAGNOSIS — N1831 Chronic kidney disease, stage 3a: Secondary | ICD-10-CM

## 2021-06-04 ENCOUNTER — Ambulatory Visit
Admission: RE | Admit: 2021-06-04 | Discharge: 2021-06-04 | Disposition: A | Discharge status: Home / Self Care | Payer: Medicare Other | Source: Ambulatory Visit | Attending: Nephrology | Admitting: Nephrology

## 2021-06-04 DIAGNOSIS — N1831 Chronic kidney disease, stage 3a: Secondary | ICD-10-CM

## 2022-01-13 IMAGING — US US RENAL
1 series · 13 of 25 positions shown · non-contrast
Comparison: None available.

CLINICAL DATA: Initial evaluation for stage III A chronic kidney
disease.

EXAM:
RENAL / URINARY TRACT ULTRASOUND COMPLETE

[Series 1: us renal · 0.23mm/px · 13 of 47 slices shown]
[im 1/47]
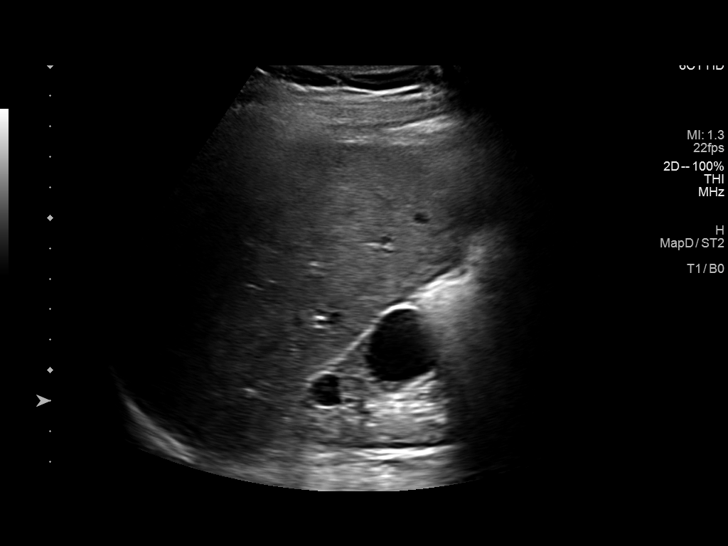
[im 4/47]
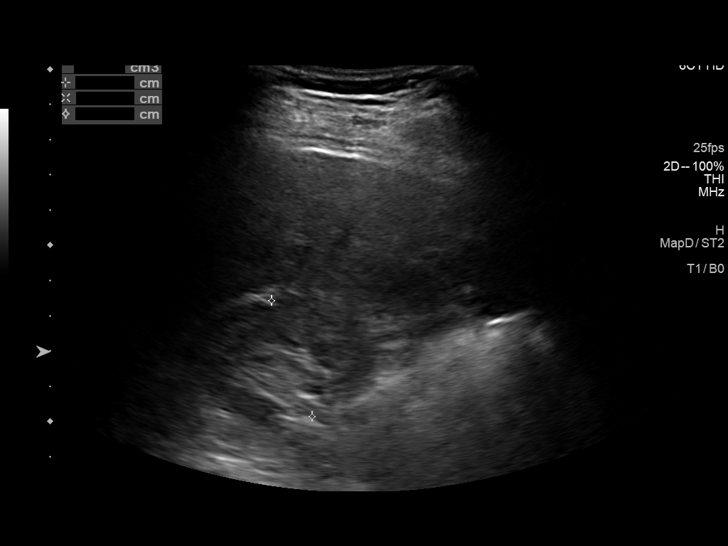
[im 8/47]
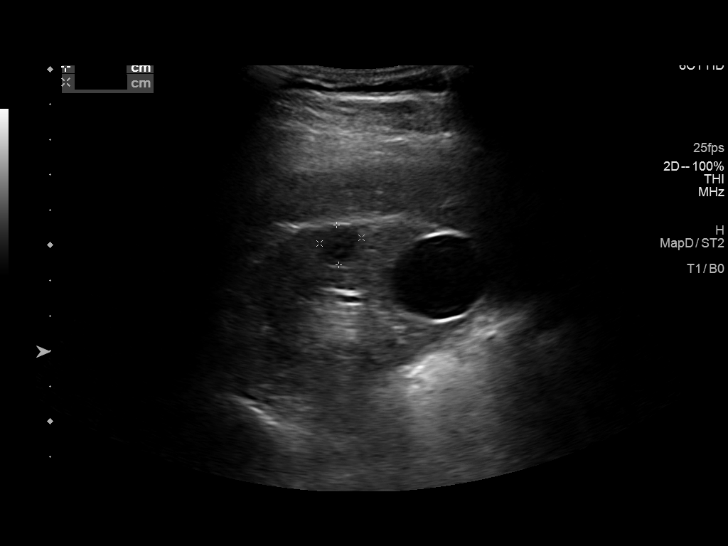
[im 12/47]
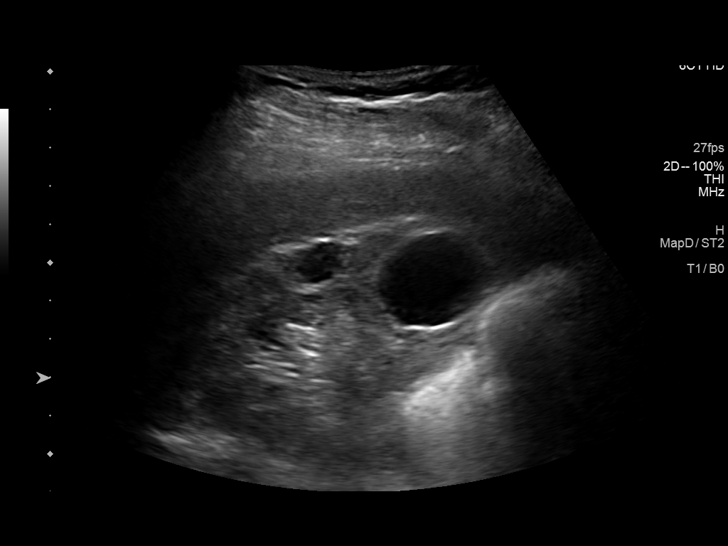
[im 16/47]
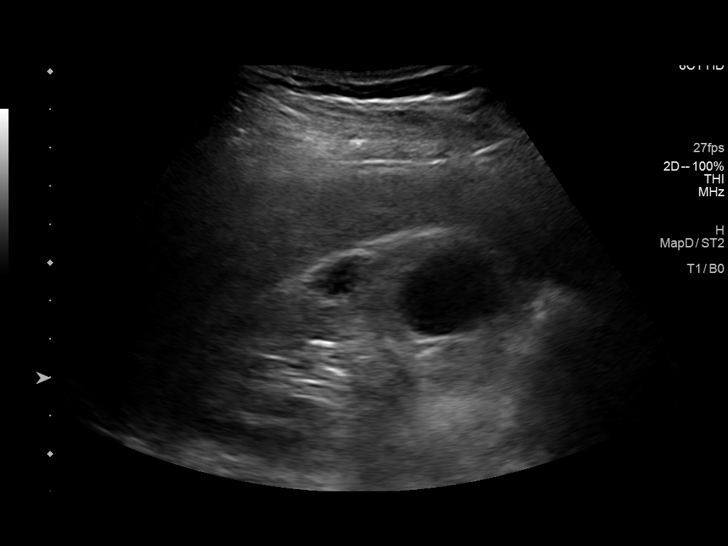
[im 20/47]
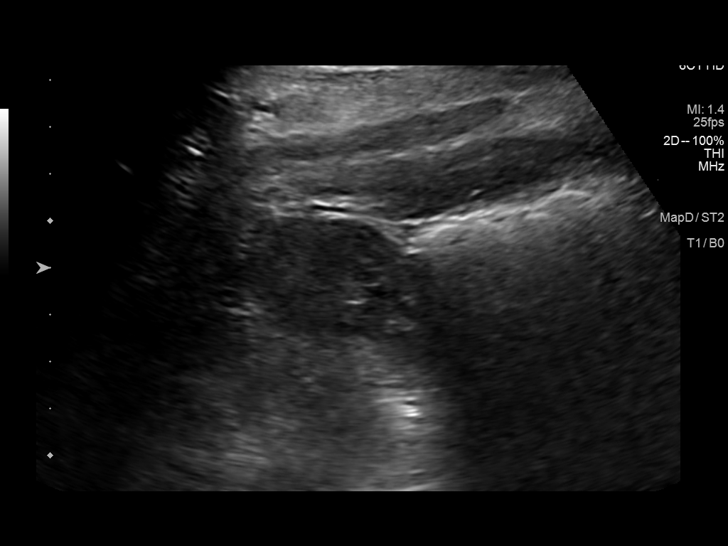
[im 24/47]
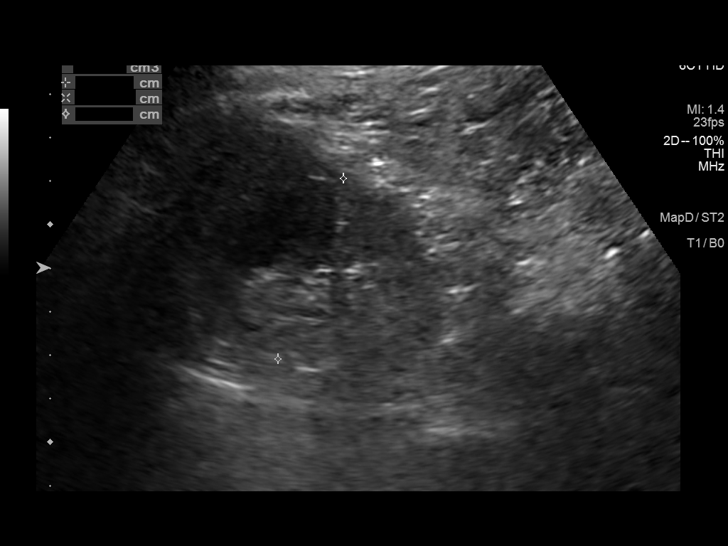
[im 27/47]
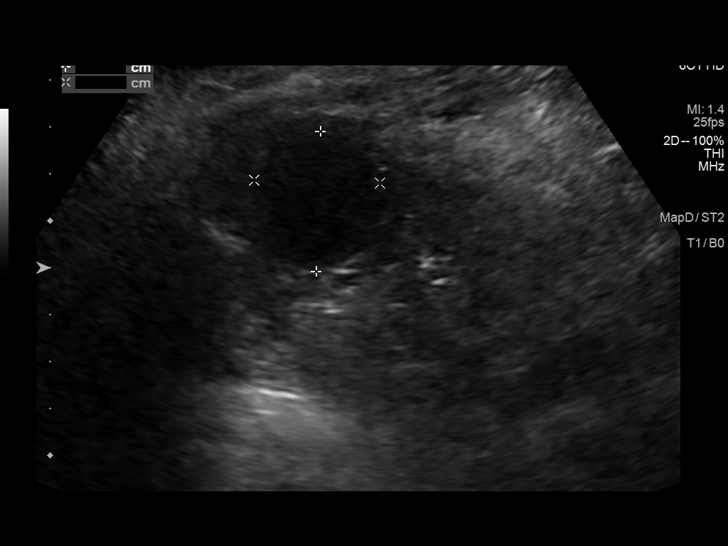
[im 31/47]
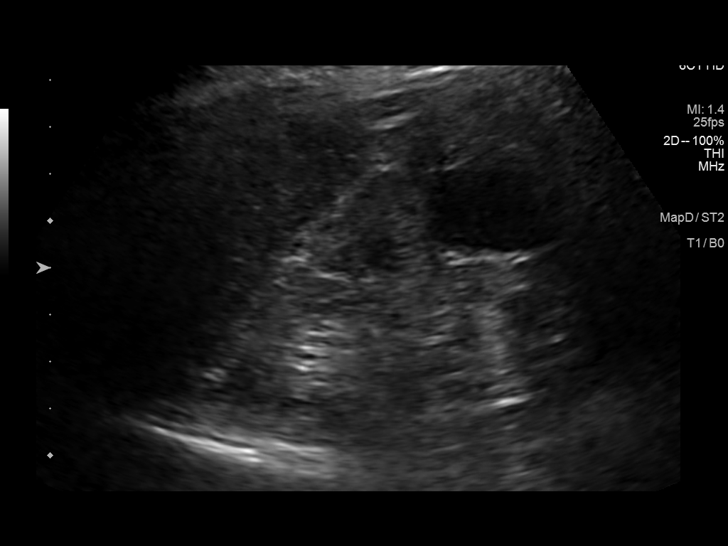
[im 35/47]
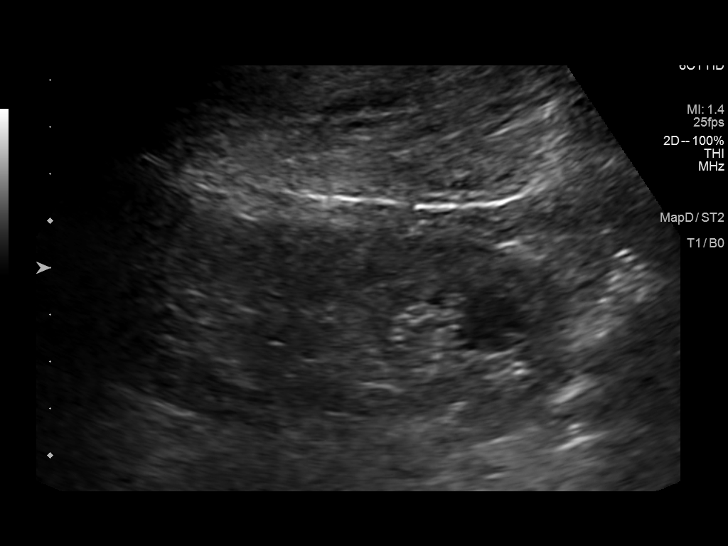
[im 39/47]
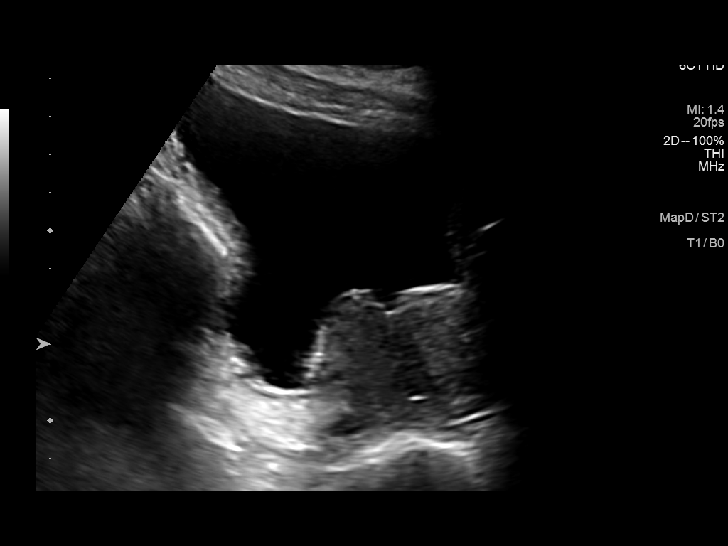
[im 43/47]
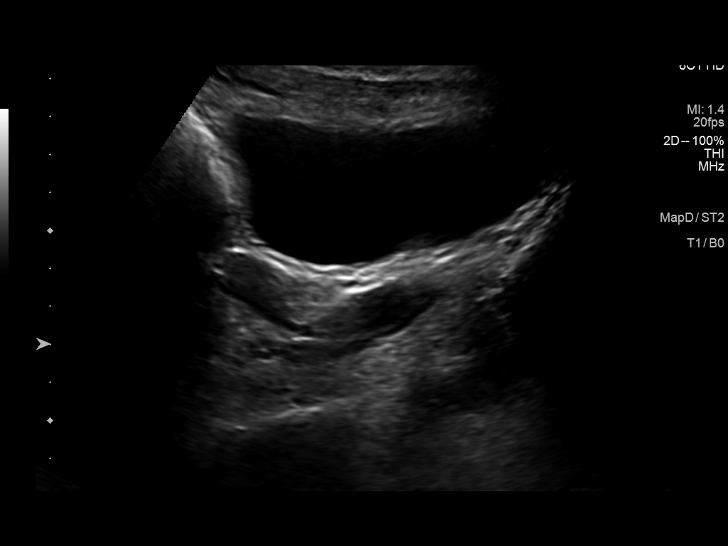
[im 47/47]
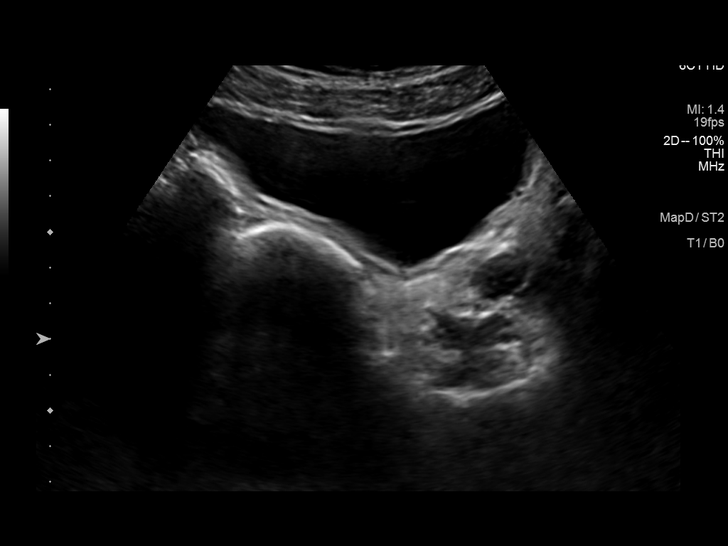

[13 of 25 positions shown; findings below may reference images not displayed]

FINDINGS: Right Kidney:

Renal measurements: 8.7 x 4.3 x 3.5 cm = volume: 67.7 mL. Mildly
increased echogenicity within the renal parenchyma. No
nephrolithiasis or hydronephrosis. 2.8 x 2.6 x 2.6 cm simple cyst
present at the lower pole. An additional 1.2 x 1.1 x 1.2 cm
benign-appearing hypoechoic cyst present at the upper pole.

Left Kidney:

Renal measurements: 9.3 x 4.3 x 4.1 cm = volume: 91.4 mL. Increased
echogenicity within the renal parenchyma. No nephrolithiasis or
hydronephrosis. 3.6 x 3.0 x 2.7 cm benign appearing cyst present at
the lower pole. 1.5 x 1.3 x 1.6 cm simple cyst also present at the
lower pole.

Bladder:

Appears normal for degree of bladder distention.

Other:

Enlarged nodular prostate invaginate upon the base of the bladder,
measuring 4.0 x 3.5 x 4.6 cm
IMPRESSION: 1. Increased echogenicity within the renal parenchyma, compatible
with medical renal disease. No hydronephrosis.
2. Multiple benign appearing renal cysts as detailed above, largest
of which measures 3.6 cm at the left lower pole.
3. Enlarged nodular prostate invaginating upon the base of the
bladder.

## 2023-03-01 ENCOUNTER — Other Ambulatory Visit: Payer: Self-pay

## 2023-03-01 ENCOUNTER — Emergency Department (HOSPITAL_COMMUNITY)
Admission: EM | Admit: 2023-03-01 | Discharge: 2023-03-01 | Disposition: A | Discharge status: Home / Self Care | Payer: Medicare Other | Attending: Emergency Medicine | Admitting: Emergency Medicine

## 2023-03-01 ENCOUNTER — Encounter (HOSPITAL_COMMUNITY): Payer: Self-pay | Admitting: Emergency Medicine

## 2023-03-01 DIAGNOSIS — K1120 Sialoadenitis, unspecified: Secondary | ICD-10-CM | POA: Insufficient documentation

## 2023-03-01 MED ORDER — ONDANSETRON 4 MG PO TBDP
4.0000 mg | ORAL_TABLET | Freq: Once | ORAL | Status: AC
Start: 2023-03-01 — End: 2023-03-01
  Administered 2023-03-01: 4 mg via ORAL
  Filled 2023-03-01: qty 1

## 2023-03-01 MED ORDER — CLINDAMYCIN HCL 150 MG PO CAPS: 150.0000 mg | ORAL_CAPSULE | Freq: Four times a day (QID) | ORAL | 0 refills | Status: DC

## 2023-03-01 MED ORDER — HYDROCODONE-ACETAMINOPHEN 5-325 MG PO TABS: 2.0000 | ORAL_TABLET | ORAL | 0 refills | Status: DC | PRN

## 2023-03-01 MED ORDER — OXYCODONE-ACETAMINOPHEN 5-325 MG PO TABS: 1.0000 | ORAL_TABLET | Freq: Four times a day (QID) | ORAL | 0 refills | Status: DC | PRN

## 2023-03-01 MED ORDER — OXYCODONE-ACETAMINOPHEN 5-325 MG PO TABS
1.0000 | ORAL_TABLET | Freq: Once | ORAL | Status: DC
  Filled 2023-03-01: qty 1

## 2023-03-01 MED ORDER — HYDROCODONE-ACETAMINOPHEN 5-325 MG PO TABS
1.0000 | ORAL_TABLET | Freq: Once | ORAL | Status: AC
  Administered 2023-03-01: 1 via ORAL
  Filled 2023-03-01: qty 1

## 2023-03-01 NOTE — ED Provider Notes (Signed)
[place] EMERGENCY DEPARTMENT AT [facility] Provider Note   CSN: [id number] Arrival date & time: 03/01/23  1636     History  Chief Complaint  Patient presents with   Salivary Stone    Nathan Castro is a 77 y.o. male.  77 year old male presents with several days of left submandibular swelling.  Patient saw his physician 3 days ago for similar symptoms and had ultrasound which showed going to the patient a salivary gland stone.  States since that time he is able to control his secretions.  No trouble swallowing.  No fever or chills.  Pain is worse with movement.  Was sent to the ER for further evaluation today       Home Medications Prior to Admission medications   Medication Sig Start Date End Date Taking? Authorizing Provider  amLODipine-benazepril (LOTREL) 10-40 MG capsule Take 1 capsule by mouth daily. 10/07/18   Margaretmary Dys, MD  Cholecalciferol (VITAMIN D3) 1.25 MG (50000 UT) CAPS Take by mouth.    [provider]  ferrous sulfate 325 (65 FE) MG tablet Take 325 mg by mouth daily with breakfast.    [provider]  Garlic 1000 MG CAPS Take by mouth.    [provider]  Omega-3 Fatty Acids (FISH OIL) 1000 MG CAPS Take by mouth.    [provider]  phenazopyridine (PYRIDIUM) 100 MG tablet Take 1 tablet (100 mg total) by mouth 3 (three) times daily as needed for pain. 09/30/18   Dorothy Puffer, MD  sildenafil (VIAGRA) 100 MG tablet Take 100 mg by mouth daily as needed for erectile dysfunction.    [provider]  tamsulosin (FLOMAX) 0.4 MG CAPS capsule Take 1 capsule (0.4 mg total) by mouth at bedtime. 09/23/18   Antony Blackbird, MD      Allergies    Patient has no known allergies.    Review of Systems   Review of Systems  All other systems reviewed and are negative.   Physical Exam Updated Vital Signs BP (!) 149/67   Pulse 66   Temp 98.5 F (36.9 C)   Resp 16   SpO2 100%  Physical Exam Vitals and nursing note  reviewed.  Constitutional:      General: He is not in acute distress.    Appearance: Normal appearance. He is well-developed. He is not toxic-appearing.  HENT:     Head: Normocephalic and atraumatic.   Eyes:     General: Lids are normal.     Conjunctiva/sclera: Conjunctivae normal.     Pupils: Pupils are equal, round, and reactive to light.  Neck:     Thyroid: No thyroid mass.     Trachea: No tracheal deviation.  Cardiovascular:     Rate and Rhythm: Normal rate and regular rhythm.     Heart sounds: Normal heart sounds. No murmur heard.    No gallop.  Pulmonary:     Effort: Pulmonary effort is normal. No respiratory distress.     Breath sounds: Normal breath sounds. No stridor. No decreased breath sounds, wheezing, rhonchi or rales.  Abdominal:     General: There is no distension.     Palpations: Abdomen is soft.     Tenderness: There is no abdominal tenderness. There is no rebound.  Musculoskeletal:        General: No tenderness. Normal range of motion.     Cervical back: Normal range of motion and neck supple.  Skin:    General: Skin is warm and dry.  Findings: No abrasion or rash.  Neurological:     Mental Status: He is alert and oriented to person, place, and time. Mental status is at baseline.     GCS: GCS eye subscore is 4. GCS verbal subscore is 5. GCS motor subscore is 6.     Cranial Nerves: Cranial nerves are intact. No cranial nerve deficit.     Sensory: No sensory deficit.     Motor: Motor function is intact.  Psychiatric:        Attention and Perception: Attention normal.        Speech: Speech normal.        Behavior: Behavior normal.     ED Results / Procedures / Treatments   Labs (all labs ordered are listed, but only abnormal results are displayed) Labs Reviewed - No data to display  EKG None  Radiology No results found.  Procedures Procedures    Medications Ordered in ED Medications  HYDROcodone-acetaminophen (NORCO/VICODIN) 5-325 MG per  tablet 1 tablet (1 tablet Oral Given 03/01/23 1811)  ondansetron (ZOFRAN-ODT) disintegrating tablet 4 mg (4 mg Oral Given 03/01/23 1812)    ED Course/ Medical Decision Making/ A&P                                 Medical Decision Making No Airway involvement appreciated. Placed on clindamycin for antibiotic coverage.  Will give dose of pain medication here as well as placed on same.  Will give ENT referral        Final Clinical Impression(s) / ED Diagnoses Final diagnoses:  None    Rx / DC Orders ED Discharge Orders     None         Lorre Nick, MD 03/01/23 2234

## 2023-03-01 NOTE — ED Provider Triage Note (Signed)
Emergency Medicine Provider Triage Evaluation Note  Nathan Castro , a 77 y.o. male  was evaluated in triage.  Pt complains of left sided salivary stone for 4 days. Endorses exquisite pain, increasing difficulty swallowing.  Review of Systems  Positive: Left sided facial / neck pain Negative: Fever, chills  Physical Exam  BP (!) 149/67   Pulse 66   Temp 98.5 F (36.9 C)   Resp 16   SpO2 100%  Gen:   Awake, no distress   Resp:  Normal effort  MSK:   Moves extremities without difficulty  Other:  Left sided neck swelling, ttp  Medical Decision Making  Medically screening exam initiated at 5:46 PM.  Appropriate orders placed.  Sayer Derbyshire was informed that the remainder of the evaluation will be completed by another provider, this initial triage assessment does not replace that evaluation, and the importance of remaining in the ED until their evaluation is complete.  [phone] -- number at [facility] medical of someone reporting patient should come for salivary stone   Olene Floss, PA-C 03/01/23 1752

## 2023-03-01 NOTE — ED Triage Notes (Signed)
Pt reports he was sent by PCP at [facility] medical for removal of salivary stone. Pt has "lump" to the left side of the face.

## 2023-03-05 ENCOUNTER — Encounter (INDEPENDENT_AMBULATORY_CARE_PROVIDER_SITE_OTHER): Payer: Self-pay | Admitting: Otolaryngology

## 2023-03-05 ENCOUNTER — Ambulatory Visit (INDEPENDENT_AMBULATORY_CARE_PROVIDER_SITE_OTHER): Payer: Medicare Other | Admitting: Otolaryngology

## 2023-03-05 VITALS — BP 180/72 | HR 64 | Ht 73.0 in | Wt 168.0 lb

## 2023-03-05 DIAGNOSIS — B9689 Other specified bacterial agents as the cause of diseases classified elsewhere: Secondary | ICD-10-CM

## 2023-03-05 DIAGNOSIS — K1121 Acute sialoadenitis: Secondary | ICD-10-CM

## 2023-03-05 NOTE — Progress Notes (Signed)
Nathan Castro is an 77 y.o. male.   Chief Complaint: submandibular infection HPI: hxof swelling of the left submax gland for years. It usually swells up then resolves quickly. This time is has remained swollen. He has a pain and large swelling in left submandibular area. He was placed on clindamycin Tuesday.   Past Medical History:  Diagnosis Date   Arthritis    rheumatoid arthritis   Bilirubinuria    Elevated PSA    Heart murmur    Hypertension    Prostate cancer (HCC)    Proteinuria     Past Surgical History:  Procedure Laterality Date   INGUINAL HERNIA REPAIR  2000   PROSTATE BIOPSY      Family History  Problem Relation Age of Onset   Breast cancer Sister    Colon cancer Brother    Prostate cancer Neg Hx    Pancreatic cancer Neg Hx    Social History:  reports that he has never smoked. He has never used smokeless tobacco. He reports that he does not drink alcohol and does not use drugs.  Allergies:  Allergies  Allergen Reactions   Percocet [Oxycodone-Acetaminophen] Nausea And Vomiting    (Not in a hospital admission)   No results found for this or any previous visit (from the past 48 hour(s)). No results found.   Blood pressure (!) 180/72, pulse 64, height 6\' 1"  (1.854 m), weight 168 lb (76.2 kg), SpO2 97%.  PHYSICAL EXAM: Appearance _ awake alert with no distress.  Head- atraumatic and no obvious abnormalities Eyes- PERRL, EOMI, no conjunctiva injection or ecchymosis Ears-  Right- Pinna without inflammation or swelling. canal without obstruction or injury. TM within normal limits  Left- Pinna without inflammation or swelling. canal without obstruction or injury. TM within normal limits Nose- no lesions or masses.  Oc/OP- no lesions or excessive swelling. Mouth opening normal.  Hp/Larynx- normal voice and no airway issues. No swelling or lesions Neck- left submandibular gland is swollen and painful. there was significant pus expressed from whartons duct. There  was a thick paste that would ot come out. Risks benefits and optons discussed and all questions answered. The tip of whartons duct snipped off and significant exudate material expressed then significant pus and saliva. The left area decompressed and he immediately felt better. no mass or lesions. Normal movement.  Neuro- CNII-XII intact, no sensory deficits.  Lungs- normal effort no distress noted    Assessment/Plan Left submandibular sialadenitis- he will put heat on it and massage. He will take 600mg  of motrin with food TID. He will follow up in 2 weeks but sooner if he is not significantly better by early next week.    Suzanna Obey 03/05/2023, 9:48 AM

## 2023-03-19 ENCOUNTER — Ambulatory Visit (INDEPENDENT_AMBULATORY_CARE_PROVIDER_SITE_OTHER): Payer: Medicare Other | Admitting: Otolaryngology

## 2023-03-19 ENCOUNTER — Encounter (INDEPENDENT_AMBULATORY_CARE_PROVIDER_SITE_OTHER): Payer: Self-pay | Admitting: Otolaryngology

## 2023-03-19 VITALS — BP 174/73 | HR 67 | Wt 179.2 lb

## 2023-03-19 DIAGNOSIS — R220 Localized swelling, mass and lump, head: Secondary | ICD-10-CM

## 2023-03-19 DIAGNOSIS — R221 Localized swelling, mass and lump, neck: Secondary | ICD-10-CM

## 2023-03-19 DIAGNOSIS — K1123 Chronic sialoadenitis: Secondary | ICD-10-CM

## 2023-03-19 DIAGNOSIS — K1121 Acute sialoadenitis: Secondary | ICD-10-CM

## 2023-03-19 MED ORDER — AMOXICILLIN-POT CLAVULANATE 875-125 MG PO TABS: 1.0000 | ORAL_TABLET | Freq: Two times a day (BID) | ORAL | 0 refills | Status: DC

## 2023-03-19 MED ORDER — METHYLPREDNISOLONE 4 MG PO TBPK: ORAL_TABLET | ORAL | 1 refills | Status: DC

## 2023-03-19 NOTE — Progress Notes (Unsigned)
ENT Office Visit:   HPI: Nathan Castro is an 77 y.o. male with hx of recent swelling in the area of left submandibular gland who was seen in the ER and subsequently was evaluated by Dr. Jearld Fenton in the office here, who presents for initial evaluation with me to follow-up on his symptoms.  He reports that he developed significant pain and swelling around the left submandibular region approximately 6 to 7 weeks ago. He went to ED with severe pain and swelling in the area of the left submandibular gland. The pain was radiating up his jaw. It was painful to swallow at the time. He had to spit up secretions that day.  He did not have imaging but was diagnosed with sialoadenitis and prescribed Clindamycin. He then saw Dr Jearld Fenton 03/04/33   Records Reviewed:  ED note 03/01/23 77 year old male presents with several days of left submandibular swelling. Patient saw his physician 3 days ago for similar symptoms and had ultrasound which showed going to the patient a salivary gland stone. States since that time he is able to control his secretions. No trouble swallowing. No fever or chills. Pain is worse with movement. Was sent to the ER for further evaluation today   Office note by Dr Jearld Fenton 03/05/23    Nathan Castro is an 77 y.o. male.   Chief Complaint: submandibular infection HPI: hxof swelling of the left submax gland for years. It usually swells up then resolves quickly. This time is has remained swollen. He has a pain and large swelling in left submandibular area. He was placed on clindamycin Tuesday.    Hp/Larynx- normal voice and no airway issues. No swelling or lesions Neck- left submandibular gland is swollen and painful. there was significant pus expressed from whartons duct. There was a thick paste that would ot come out. Risks benefits and optons discussed and all questions answered. The tip of whartons duct snipped off and significant exudate material expressed then significant pus and saliva. The left  area decompressed and he immediately felt better. no mass or lesions. Normal movement.  Neuro- CNII-XII intact, no sensory deficits.  Lungs- normal effort no distress noted       Assessment/Plan Left submandibular sialadenitis- he will put heat on it and massage. He will take 600mg  of motrin with food TID. He will follow up in 2 weeks but sooner if he is not significantly better by early next week.      Past Medical History:  Diagnosis Date   Arthritis    rheumatoid arthritis   Bilirubinuria    Elevated PSA    Heart murmur    Hypertension    Prostate cancer (HCC)    Proteinuria     Past Surgical History:  Procedure Laterality Date   INGUINAL HERNIA REPAIR  2000   PROSTATE BIOPSY      Family History  Problem Relation Age of Onset   Breast cancer Sister    Colon cancer Brother    Prostate cancer Neg Hx    Pancreatic cancer Neg Hx     Social History:  reports that he has never smoked. He has never used smokeless tobacco. He reports that he does not drink alcohol and does not use drugs.  Allergies:  Allergies  Allergen Reactions   Percocet [Oxycodone-Acetaminophen] Nausea And Vomiting    Medications: I have reviewed the patient's current medications.  The PMH, PSH, Medications, Allergies, and SH were reviewed and updated.  ROS: Constitutional: Negative for fever, weight loss and weight gain.  Cardiovascular: Negative for chest pain and dyspnea on exertion. Respiratory: Is not experiencing shortness of breath at rest. Gastrointestinal: Negative for nausea and vomiting. Neurological: Negative for headaches. Psychiatric: The patient is not nervous/anxious  Blood pressure (!) 174/73, pulse 67, weight 179 lb 3.2 oz (81.3 kg), SpO2 99%.  PHYSICAL EXAM:  Exam: General: Well-developed, well-nourished Communication and Voice: Clear pitch and clarity Respiratory Respiratory effort: Equal inspiration and expiration without stridor Cardiovascular Peripheral Vascular:  Warm extremities with equal color/perfusion Eyes: No nystagmus with equal extraocular motion bilaterally Neuro/Psych/Balance: Patient oriented to person, place, and time; Appropriate mood and affect; Gait is intact with no imbalance; Cranial nerves I-XII are intact Head and Face Inspection: Normocephalic and atraumatic without mass or lesion Palpation: Facial skeleton intact without bony stepoffs Salivary Glands:left SMG area with swelling/gland enlargement, no tenderness on exam Facial Strength: Facial motility symmetric and full bilaterally ENT Pinna: External ear intact and fully developed External canal: Canal is patent with intact skin Tympanic Membrane: Clear and mobile External Nose: No scar or anatomic deformity Internal Nose: Septum is relatively straight on anterior rhinoscopy. Bilateral inferior turbinate hypertrophy.  Lips, Teeth, and gums: Mucosa and teeth intact and viable TMJ: No pain to palpation with full mobility Oral cavity/oropharynx: No erythema or exudate, no lesions present Clear salivary drainage from b/l Wharton ducts, no purulence, no FOM edema, no palpable stone along the course of the left Wharton duct  Neck Neck and Trachea: Midline trachea without mass or lesion Thyroid: No mass or nodularity Lymphatics: No lymphadenopathy   Studies Reviewed:none  Assessment/Plan: Encounter Diagnoses  Name Primary?   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  He underwent drainage of purulent contents by Dr. Jearld Fenton during his follow-up with him 03/05/2023, with symptom relief at the time.  He also previously was treated with a course of clindamycin.   -Here for 2-week follow-up and initial evaluation with me.  No evidence of persistent purulent drainage from [facility] duct today on exam, no floor mouth  swelling, no palpable stone.  Left submandibular gland appears to be enlarged still, but there was no tenderness today.  He had no prior imaging of his neck and due to persistent swelling in the left submandibular region, we will obtain CT neck to rule out stones or neoplasm.  -I discussed the importance of continuing supportive care for sialadenitis including sialagogues, warm compresses, good hydration, gentle gland massage.  -I discussed that his symptoms could recur or worsen, and advised him to have a prescription for Augmentin and steroid pack on hand should that occur.  He will only take it if his symptoms worsen or persist.   -He will return after imaging in a few weeks for repeat exam and imaging results review Thank you for allowing me to participate in the care of this patient. Please do not hesitate to contact me with any questions or concerns.   Ashok Croon, MD Otolaryngology [facility] Phone: [phone] Fax: [phone]    03/20/2023, 6:32 AM

## 2023-03-22 ENCOUNTER — Ambulatory Visit (HOSPITAL_COMMUNITY)
Admission: RE | Admit: 2023-03-22 | Discharge: 2023-03-22 | Disposition: A | Discharge status: Home / Self Care | Payer: Medicare Other | Source: Ambulatory Visit | Attending: Otolaryngology | Admitting: Otolaryngology

## 2023-03-22 DIAGNOSIS — R221 Localized swelling, mass and lump, neck: Secondary | ICD-10-CM | POA: Insufficient documentation

## 2023-03-22 DIAGNOSIS — R220 Localized swelling, mass and lump, head: Secondary | ICD-10-CM | POA: Diagnosis present

## 2023-03-22 LAB — POCT I-STAT CREATININE: Creatinine, Ser: 1.9 mg/dL — ABNORMAL HIGH (ref 0.61–1.24)

## 2023-03-22 MED ORDER — IOHEXOL 300 MG/ML  SOLN
60.0000 mL | Freq: Once | INTRAMUSCULAR | Status: AC | PRN
  Administered 2023-03-22: 60 mL via INTRAVENOUS

## 2023-03-22 MED ORDER — SODIUM CHLORIDE (PF) 0.9 % IJ SOLN
INTRAMUSCULAR | Status: AC
  Filled 2023-03-22: qty 50

## 2023-05-06 ENCOUNTER — Ambulatory Visit (INDEPENDENT_AMBULATORY_CARE_PROVIDER_SITE_OTHER): Payer: Medicare Other | Admitting: Otolaryngology

## 2023-05-06 ENCOUNTER — Encounter (INDEPENDENT_AMBULATORY_CARE_PROVIDER_SITE_OTHER): Payer: Self-pay | Admitting: Otolaryngology

## 2023-05-06 VITALS — BP 176/77 | HR 68

## 2023-05-06 DIAGNOSIS — K115 Sialolithiasis: Secondary | ICD-10-CM | POA: Diagnosis not present

## 2023-05-06 DIAGNOSIS — K1123 Chronic sialoadenitis: Secondary | ICD-10-CM

## 2023-05-06 NOTE — Progress Notes (Signed)
ENT Office Visit:  Update 05/06/2023: He returns after CT neck which revealed large left submandibular duct stone and obstructive stasis of saliva in the left submandibular gland, but no masses or lesions and no lymphadenopathy.  Since his last office visit he had 1 more episode of swelling and pain along the left submandibular gland.  He took Augmentin I prescribed, and symptoms improved.  He did not take steroids.   Initial evaluation 03/19/2023 HPI: Nathan Castro is an 77 y.o. male with hx of recent swelling in the area of left submandibular gland who was seen in the ER and subsequently was evaluated by Dr. Jearld Fenton in the office here, who presents for initial evaluation with me to follow-up on his symptoms.  He reports that he developed significant pain and swelling around the left submandibular region approximately 6 to 7 weeks ago. He went to ED with severe pain and swelling in the area of the left submandibular gland. The pain was radiating up his jaw. It was painful to swallow at the time. He had to spit up secretions that day.  He did not have imaging but was diagnosed with sialoadenitis and prescribed Clindamycin. He then saw Dr Jearld Fenton 03/04/33   Records Reviewed:  ED note 03/01/23 77 year old male presents with several days of left submandibular swelling. Patient saw his physician 3 days ago for similar symptoms and had ultrasound which showed going to the patient a salivary gland stone. States since that time he is able to control his secretions. No trouble swallowing. No fever or chills. Pain is worse with movement. Was sent to the ER for further evaluation today   Office note by Dr Jearld Fenton 03/05/23    Nathan Castro is an 77 y.o. male.   Chief Complaint: submandibular infection HPI: hxof swelling of the left submax gland for years. It usually swells up then resolves quickly. This time is has remained swollen. He has a pain and large swelling in left submandibular area. He was placed on  clindamycin Tuesday.    Hp/Larynx- normal voice and no airway issues. No swelling or lesions Neck- left submandibular gland is swollen and painful. there was significant pus expressed from whartons duct. There was a thick paste that would ot come out. Risks benefits and optons discussed and all questions answered. The tip of whartons duct snipped off and significant exudate material expressed then significant pus and saliva. The left area decompressed and he immediately felt better. no mass or lesions. Normal movement.  Neuro- CNII-XII intact, no sensory deficits.  Lungs- normal effort no distress noted       Assessment/Plan Left submandibular sialadenitis- he will put heat on it and massage. He will take 600mg  of motrin with food TID. He will follow up in 2 weeks but sooner if he is not significantly better by early next week.      Past Medical History:  Diagnosis Date   Arthritis    rheumatoid arthritis   Bilirubinuria    Elevated PSA    Heart murmur    Hypertension    Prostate cancer (HCC)    Proteinuria     Past Surgical History:  Procedure Laterality Date   INGUINAL HERNIA REPAIR  2000   PROSTATE BIOPSY      Family History  Problem Relation Age of Onset   Breast cancer Sister    Colon cancer Brother    Prostate cancer Neg Hx    Pancreatic cancer Neg Hx     Social History:  reports that  he has never smoked. He has never used smokeless tobacco. He reports that he does not drink alcohol and does not use drugs.  Allergies:  Allergies  Allergen Reactions   Percocet [Oxycodone-Acetaminophen] Nausea And Vomiting    Medications: I have reviewed the patient's current medications.  The PMH, PSH, Medications, Allergies, and SH were reviewed and updated.  ROS: Constitutional: Negative for fever, weight loss and weight gain. Cardiovascular: Negative for chest pain and dyspnea on exertion. Respiratory: Is not experiencing shortness of breath at rest. Gastrointestinal:  Negative for nausea and vomiting. Neurological: Negative for headaches. Psychiatric: The patient is not nervous/anxious  Blood pressure (!) 176/77, pulse 68, SpO2 98%.  PHYSICAL EXAM:  Exam: General: Well-developed, well-nourished Respiratory Respiratory effort: Equal inspiration and expiration without stridor Cardiovascular Peripheral Vascular: Warm extremities with equal color/perfusion Eyes: No nystagmus with equal extraocular motion bilaterally Neuro/Psych/Balance: Patient oriented to person, place, and time; Appropriate mood and affect; Gait is intact with no imbalance; Cranial nerves I-XII are intact Head and Face Inspection: Normocephalic and atraumatic without mass or lesion Palpation: Facial skeleton intact without bony stepoffs Salivary Glands:left SMG area with swelling/gland enlargement, but significantly improved compared to prior exam, no tenderness on exam Facial Strength: Facial motility symmetric and full bilaterally ENT Pinna: External ear intact and fully developed External canal: Canal is patent with intact skin Tympanic Membrane: Clear and mobile External Nose: No scar or anatomic deformity Internal Nose: Septum is relatively straight on anterior rhinoscopy. Bilateral inferior turbinate hypertrophy.  Lips, Teeth, and gums: Mucosa and teeth intact and viable TMJ: No pain to palpation with full mobility Oral cavity/oropharynx: No erythema or exudate, no lesions present Clear salivary drainage from b/l Wharton ducts, no purulence, no FOM edema, along more proximal portion of the duct on the left that there is a palpable firmness likely corresponding with identified salivary duct stone Neck Neck and Trachea: Midline trachea without mass or lesion Thyroid: No mass or nodularity Lymphatics: No lymphadenopathy   Studies Reviewed:CT   FINDINGS: Pharynx and larynx: Laryngeal and pharyngeal soft tissue contours are within normal limits. Negative parapharyngeal  and retropharyngeal spaces.   Salivary glands: Palpable area of concern marked on series 2, image 64 corresponding to the left submandibular gland. The gland is asymmetrically enlarged and heterogeneous. However, branching hypodensity within the gland is consistent with dilated intraglandular ducts. And at the left S mg hilum the main duct is dilated nearly 9 mm diameter. Downstream of that in the left submandibular space there is a large oval 14 mm long by nearly 10 mm diameter left submandibular duct sialolith (series 2, image 51) with a smaller but still 3-4 mm sallow lith just posterior to the dominant stone.   No definite active inflammation superimposed in either the left submandibular or the left sublingual space.   The sublingual glands are diminutive. The contralateral right submandibular gland appears more normal, with only mild ductal ectasia. There is punctate left parotid sialolithiasis inferiorly. Otherwise both parotid glands appear normal.   Thyroid: Negative.   Lymph nodes: Negative.  No cervical lymphadenopathy.   Vascular: Major vascular structures in the neck and at the skull base are patent. Mild arterial tortuosity but minimal for age atherosclerosis in the neck. Left vertebral artery appears to be dominant, normal variant.   Limited intracranial: Negative.   Visualized orbits: Negative.   Mastoids and visualized paranasal sinuses: Clear.   Skeleton: No acute dental finding. Torus mandibularis, normal variant. Age-appropriate cervical spine degeneration. Normal background bone mineralization. No acute  or suspicious osseous lesion identified.   Upper chest: Negative visible superior mediastinum aside from mild tortuosity of the aortic arch. Mild upper lung atelectasis, subpleural scarring.   IMPRESSION: 1. Bulky 14 x 10 mm obstructing salivary stone in the anterior left main submandibular duct is associated with severe ductal enlargement at the  hilum and within the parenchyma of the left submandibular gland, causing asymmetric gland enlargement at the palpable area of concern. But no definite active inflammation. And there is a smaller 3-4 mm sialolith just upstream of the dominant stone.   2. Minimal other sialolithiasis (left parotid). No other neck mass. No lymphadenopathy. No other acute or inflammatory process identified in the Neck.    Assessment/Plan: Encounter Diagnoses  Name Primary?   Chronic sialoadenitis Yes   Salivary duct stone      Left submandibular swelling and pain, recent episode of bacterial sialadenitis on the left side involving left submandibular gland.  He underwent drainage of purulent contents by Dr. Jearld Fenton during his follow-up with him 03/05/2023, with symptom relief at the time.  He also previously was treated with a course of clindamycin.   -Here for 2-week follow-up and initial evaluation with me.  No evidence of persistent purulent drainage from [facility] duct today on exam, no floor mouth swelling, no palpable stone.  Left submandibular gland appears to be enlarged still, but there was no tenderness today.  He had no prior imaging of his neck and due to persistent swelling in the left submandibular region, we will obtain CT neck to rule out stones or neoplasm.  -I discussed the importance of continuing supportive care for sialadenitis including sialagogues, warm compresses, good hydration, gentle gland massage.  -I discussed that his symptoms could recur or worsen, and advised him to have a prescription for Augmentin and steroid pack on hand should that occur.  He will only take it if his symptoms worsen or persist.   -He will return after imaging in a few weeks for repeat exam and imaging results review Thank you for allowing me to participate in the care of this patient. Please do not hesitate to contact me with any questions or concerns.   Update 05/06/23 had another episode of left submandibular  gland swelling which responded to a course of Augmentin.  CT neck which I reviewed with the patient today shows a large left Wharton duct stone along the floor mouth and obstructive stasis of saliva and left submandibular gland.   Chronic left submandibular gland sialoadenitis and large salivary duct stone with stasis of saliva -We discussed management options including observation and medical management as needed for recurrent episodes of pain and swelling versus transoral excision and removal of salivary duct stone with sialodochoplasty -I discussed risks and benefits of surgical procedure and he would like to proceed to avoid any further episodes of sialoadenitis - No history of stroke or heart attack, and not on blood thinner -Will proceed with surgery  Ashok Croon, MD Otolaryngology [facility] Phone: [phone] Fax: [phone]    05/07/2023, 6:13 AM

## 2023-06-22 ENCOUNTER — Other Ambulatory Visit (INDEPENDENT_AMBULATORY_CARE_PROVIDER_SITE_OTHER): Payer: Self-pay | Admitting: Otolaryngology

## 2023-06-22 DIAGNOSIS — K1123 Chronic sialoadenitis: Secondary | ICD-10-CM | POA: Diagnosis not present

## 2023-06-22 DIAGNOSIS — K115 Sialolithiasis: Secondary | ICD-10-CM

## 2023-06-22 DIAGNOSIS — R220 Localized swelling, mass and lump, head: Secondary | ICD-10-CM

## 2023-06-22 MED ORDER — METHYLPREDNISOLONE 4 MG PO TBPK: ORAL_TABLET | ORAL | 1 refills | Status: DC

## 2023-06-22 MED ORDER — OXYCODONE HCL 5 MG PO TABS: 5.0000 mg | ORAL_TABLET | Freq: Four times a day (QID) | ORAL | 0 refills | Status: DC | PRN

## 2023-06-22 MED ORDER — CHLORHEXIDINE GLUCONATE 0.12 % MT SOLN: 15.0000 mL | Freq: Two times a day (BID) | OROMUCOSAL | 0 refills | Status: AC

## 2023-06-22 MED ORDER — AMOXICILLIN-POT CLAVULANATE 875-125 MG PO TABS: 1.0000 | ORAL_TABLET | Freq: Two times a day (BID) | ORAL | 0 refills | Status: AC

## 2023-06-22 NOTE — Addendum Note (Signed)
Addended by: Ashok Croon on: 06/22/2023 11:50 AM   Modules accepted: Orders

## 2023-06-22 NOTE — Progress Notes (Addendum)
Medication orders for post-op period (abx, steroid and pain control, oral peridex) to be taken after surgery today

## 2023-06-25 ENCOUNTER — Telehealth (INDEPENDENT_AMBULATORY_CARE_PROVIDER_SITE_OTHER): Payer: Self-pay

## 2023-06-25 ENCOUNTER — Telehealth (INDEPENDENT_AMBULATORY_CARE_PROVIDER_SITE_OTHER): Payer: Self-pay | Admitting: Otolaryngology

## 2023-06-25 NOTE — Telephone Encounter (Signed)
I spoke to the patient and his wife about the stent under his tongue feeling loose.   Dr Irene Pap advised me to let them know it would be ok, if the stent came out it would not hurt anything and she would assess the patient on his post op appt in January.

## 2023-06-25 NOTE — Telephone Encounter (Signed)
Patient spouse left voice-mail regarding post-op stint/stitches questions concerns.  Please call [phone].

## 2023-07-08 ENCOUNTER — Ambulatory Visit (INDEPENDENT_AMBULATORY_CARE_PROVIDER_SITE_OTHER): Payer: Medicare Other | Admitting: Otolaryngology

## 2023-07-08 ENCOUNTER — Encounter (INDEPENDENT_AMBULATORY_CARE_PROVIDER_SITE_OTHER): Payer: Self-pay | Admitting: Otolaryngology

## 2023-07-08 VITALS — BP 197/84 | HR 86

## 2023-07-08 DIAGNOSIS — K1123 Chronic sialoadenitis: Secondary | ICD-10-CM

## 2023-07-08 DIAGNOSIS — R221 Localized swelling, mass and lump, neck: Secondary | ICD-10-CM

## 2023-07-08 DIAGNOSIS — R220 Localized swelling, mass and lump, head: Secondary | ICD-10-CM

## 2023-07-08 DIAGNOSIS — K115 Sialolithiasis: Secondary | ICD-10-CM

## 2023-07-08 NOTE — Progress Notes (Signed)
 ENT Office Visit:  Update 07/08/23:  Discussed the use of AI scribe software for clinical note transcription with the patient, who gave verbal consent to proceed.  History of Present Illness   The patient is a 78 yoM with hx of chronic sialoadenitis and large salivary gland stones, who returns after trans-oral removal and sialodochoplasty. They report feeling great with no pain. They noticed a small piece of what they believed to be suture material in their mouth post-procedure, but this did not cause any discomfort. The salivary stent fell out 3 days after the surgery. They have been able to eat and swallow well, indicating good salivary flow. The patient was on an antibiotic regimen and finished it. They have not experienced any swelling or infection since the procedure.     Update 05/06/2023: He returns after CT neck which revealed large left submandibular duct stone and obstructive stasis of saliva in the left submandibular gland, but no masses or lesions and no lymphadenopathy.  Since his last office visit he had 1 more episode of swelling and pain along the left submandibular gland.  He took Augmentin  I prescribed, and symptoms improved.  He did not take steroids.   Initial evaluation 03/19/2023 HPI: Nathan Castro is an 78 y.o. male with hx of recent swelling in the area of left submandibular gland who was seen in the ER and subsequently was evaluated by Dr. Roark in the office here, who presents for initial evaluation with me to follow-up on his symptoms.  He reports that he developed significant pain and swelling around the left submandibular region approximately 6 to 7 weeks ago. He went to ED with severe pain and swelling in the area of the left submandibular gland. The pain was radiating up his jaw. It was painful to swallow at the time. He had to spit up secretions that day.  He did not have imaging but was diagnosed with sialoadenitis and prescribed Clindamycin . He then saw Dr Roark  03/04/33   Records Reviewed:  ED note 03/01/23 78 year old male presents with several days of left submandibular swelling. Patient saw his physician 3 days ago for similar symptoms and had ultrasound which showed going to the patient a salivary gland stone. States since that time he is able to control his secretions. No trouble swallowing. No fever or chills. Pain is worse with movement. Was sent to the ER for further evaluation today   Office note by Dr Roark 03/05/23    Nathan Castro is an 78 y.o. male.   Chief Complaint: submandibular infection HPI: hxof swelling of the left submax gland for years. It usually swells up then resolves quickly. This time is has remained swollen. He has a pain and large swelling in left submandibular area. He was placed on clindamycin  Tuesday.    Hp/Larynx- normal voice and no airway issues. No swelling or lesions Neck- left submandibular gland is swollen and painful. there was significant pus expressed from whartons duct. There was a thick paste that would ot come out. Risks benefits and optons discussed and all questions answered. The tip of whartons duct snipped off and significant exudate material expressed then significant pus and saliva. The left area decompressed and he immediately felt better. no mass or lesions. Normal movement.  Neuro- CNII-XII intact, no sensory deficits.  Lungs- normal effort no distress noted       Assessment/Plan Left submandibular sialadenitis- he will put heat on it and massage. He will take 600mg  of motrin with food TID. He will  follow up in 2 weeks but sooner if he is not significantly better by early next week.      Past Medical History:  Diagnosis Date   Arthritis    rheumatoid arthritis   Bilirubinuria    Elevated PSA    Heart murmur    Hypertension    Prostate cancer (HCC)    Proteinuria     Past Surgical History:  Procedure Laterality Date   INGUINAL HERNIA REPAIR  2000   PROSTATE BIOPSY      Family  History  Problem Relation Age of Onset   Breast cancer Sister    Colon cancer Brother    Prostate cancer Neg Hx    Pancreatic cancer Neg Hx     Social History:  reports that he has never smoked. He has never used smokeless tobacco. He reports that he does not drink alcohol and does not use drugs.  Allergies:  Allergies  Allergen Reactions   Percocet [Oxycodone -Acetaminophen ] Nausea And Vomiting    Medications: I have reviewed the patient's current medications.  The PMH, PSH, Medications, Allergies, and SH were reviewed and updated.  ROS: Constitutional: Negative for fever, weight loss and weight gain. Cardiovascular: Negative for chest pain and dyspnea on exertion. Respiratory: Is not experiencing shortness of breath at rest. Gastrointestinal: Negative for nausea and vomiting. Neurological: Negative for headaches. Psychiatric: The patient is not nervous/anxious  Blood pressure (!) 197/84, pulse 86, SpO2 100%.  PHYSICAL EXAM:  Exam: General: Well-developed, well-nourished Respiratory Respiratory effort: Equal inspiration and expiration without stridor Cardiovascular Peripheral Vascular: Warm extremities with equal color/perfusion Eyes: No nystagmus with equal extraocular motion bilaterally Neuro/Psych/Balance: Patient oriented to person, place, and time; Appropriate mood and affect; Gait is intact with no imbalance; Cranial nerves I-XII are intact Head and Face Inspection: Normocephalic and atraumatic without mass or lesion Palpation: Facial skeleton intact without bony stepoffs Salivary Glands: normal to palpation bilaterally Facial Strength: Facial motility symmetric and full bilaterally ENT  Lips, Teeth, and gums: Mucosa and teeth intact and viable Oral cavity/oropharynx: evidence of completely healed opening in the left Wharton's duct Nylon suture removed. Proximal opening into the duct appears patent with drainage of saliva Neck Neck and Trachea: Midline trachea  without mass or lesion Thyroid: No mass or nodularity Lymphatics: No lymphadenopathy   Studies Reviewed:CT   FINDINGS: Pharynx and larynx: Laryngeal and pharyngeal soft tissue contours are within normal limits. Negative parapharyngeal and retropharyngeal spaces.   Salivary glands: Palpable area of concern marked on series 2, image 64 corresponding to the left submandibular gland. The gland is asymmetrically enlarged and heterogeneous. However, branching hypodensity within the gland is consistent with dilated intraglandular ducts. And at the left S mg hilum the main duct is dilated nearly 9 mm diameter. Downstream of that in the left submandibular space there is a large oval 14 mm long by nearly 10 mm diameter left submandibular duct sialolith (series 2, image 51) with a smaller but still 3-4 mm sallow lith just posterior to the dominant stone.   No definite active inflammation superimposed in either the left submandibular or the left sublingual space.   The sublingual glands are diminutive. The contralateral right submandibular gland appears more normal, with only mild ductal ectasia. There is punctate left parotid sialolithiasis inferiorly. Otherwise both parotid glands appear normal.   Thyroid: Negative.   Lymph nodes: Negative.  No cervical lymphadenopathy.   Vascular: Major vascular structures in the neck and at the skull base are patent. Mild arterial tortuosity but minimal  for age atherosclerosis in the neck. Left vertebral artery appears to be dominant, normal variant.   Limited intracranial: Negative.   Visualized orbits: Negative.   Mastoids and visualized paranasal sinuses: Clear.   Skeleton: No acute dental finding. Torus mandibularis, normal variant. Age-appropriate cervical spine degeneration. Normal background bone mineralization. No acute or suspicious osseous lesion identified.   Upper chest: Negative visible superior mediastinum aside from  mild tortuosity of the aortic arch. Mild upper lung atelectasis, subpleural scarring.   IMPRESSION: 1. Bulky 14 x 10 mm obstructing salivary stone in the anterior left main submandibular duct is associated with severe ductal enlargement at the hilum and within the parenchyma of the left submandibular gland, causing asymmetric gland enlargement at the palpable area of concern. But no definite active inflammation. And there is a smaller 3-4 mm sialolith just upstream of the dominant stone.   2. Minimal other sialolithiasis (left parotid). No other neck mass. No lymphadenopathy. No other acute or inflammatory process identified in the Neck.    Assessment/Plan: Encounter Diagnoses  Name Primary?   Salivary duct stone Yes   Chronic sialoadenitis    Submandibular swelling       Left submandibular swelling and pain, recent episode of bacterial sialadenitis on the left side involving left submandibular gland.  He underwent drainage of purulent contents by Dr. Roark during his follow-up with him 03/05/2023, with symptom relief at the time.  He also previously was treated with a course of clindamycin .   -Here for 2-week follow-up and initial evaluation with me.  No evidence of persistent purulent drainage from [facility] duct today on exam, no floor mouth swelling, no palpable stone.  Left submandibular gland appears to be enlarged still, but there was no tenderness today.  He had no prior imaging of his neck and due to persistent swelling in the left submandibular region, we will obtain CT neck to rule out stones or neoplasm.  -I discussed the importance of continuing supportive care for sialadenitis including sialagogues, warm compresses, good hydration, gentle gland massage.  -I discussed that his symptoms could recur or worsen, and advised him to have a prescription for Augmentin  and steroid pack on hand should that occur.  He will only take it if his symptoms worsen or persist.   -He will  return after imaging in a few weeks for repeat exam and imaging results review Thank you for allowing me to participate in the care of this patient. Please do not hesitate to contact me with any questions or concerns.   Update 05/06/23 had another episode of left submandibular gland swelling which responded to a course of Augmentin .  CT neck which I reviewed with the patient today shows a large left Wharton duct stone along the floor mouth and obstructive stasis of saliva and left submandibular gland.   Chronic left submandibular gland sialoadenitis and large salivary duct stone with stasis of saliva -We discussed management options including observation and medical management as needed for recurrent episodes of pain and swelling versus transoral excision and removal of salivary duct stone with sialodochoplasty -I discussed risks and benefits of surgical procedure and he would like to proceed to avoid any further episodes of sialoadenitis - No history of stroke or heart attack, and not on blood thinner -Will proceed with surgery  Update 07/08/23 s/p left submandibular glad duct stone removal and sialodochoplasty with stent placement, did well post-op. Proximal duct opening appears patent. Sutures removed.   - hydration and sialogogues  - gentle gland massage -  return if experience any recurrent left submandibular gland swelling  Elena Larry, MD Otolaryngology [facility] Phone: [phone] Fax: [phone]    07/08/2023, 2:18 PM

## 2023-07-22 ENCOUNTER — Encounter (INDEPENDENT_AMBULATORY_CARE_PROVIDER_SITE_OTHER): Payer: Self-pay | Admitting: Otolaryngology

## 2023-07-22 ENCOUNTER — Ambulatory Visit (INDEPENDENT_AMBULATORY_CARE_PROVIDER_SITE_OTHER): Payer: Medicare Other | Admitting: Otolaryngology

## 2023-07-22 VITALS — BP 185/79 | HR 82

## 2023-07-22 DIAGNOSIS — R220 Localized swelling, mass and lump, head: Secondary | ICD-10-CM

## 2023-07-22 DIAGNOSIS — K1123 Chronic sialoadenitis: Secondary | ICD-10-CM

## 2023-07-22 DIAGNOSIS — K115 Sialolithiasis: Secondary | ICD-10-CM | POA: Diagnosis not present

## 2023-07-22 NOTE — Progress Notes (Signed)
ENT Office Visit:  Update 07/22/23 he noticed some swelling around left submandibular gland. No pain. Reports feeling a "knot" along the right floor of   Update 07/08/23:  Discussed the use of AI scribe software for clinical note transcription with the patient, who gave verbal consent to proceed.  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 They report feeling great with no pain. They noticed a small piece of what they believed to be suture material in their mouth post-procedure, but this did not cause any discomfort. The salivary stent fell out 3 days after the surgery. They have been able to eat and swallow well, indicating good salivary flow. The patient was on an antibiotic regimen and finished it. They have not experienced any swelling or infection since the procedure.     Update 05/06/2023: He returns after CT neck which revealed large left submandibular duct stone and obstructive stasis of saliva in the left submandibular gland, but no masses or lesions and no lymphadenopathy.  Since his last office visit he had 1 more episode of swelling and pain along the left submandibular gland.  He took Augmentin I prescribed, and symptoms improved.  He did not take steroids.   Initial evaluation 03/19/2023 HPI: Nathan Castro is an 78 y.o. male with hx of recent swelling in the area of left submandibular gland who was seen in the ER and subsequently was evaluated by Dr. Jearld Fenton in the office here, who presents for initial evaluation with me to follow-up on his symptoms.  He reports that he developed significant pain and swelling around the left submandibular region approximately 6 to 7 weeks ago. He went to ED with severe pain and swelling in the area of the left submandibular gland. The pain was radiating up his jaw. It was painful to swallow at the time. He had to spit up  secretions that day.  He did not have imaging but was diagnosed with sialoadenitis and prescribed Clindamycin. He then saw Dr Jearld Fenton 03/04/33   Records Reviewed:  ED note 03/01/23 78 year old male presents with several days of left submandibular swelling. Patient saw his physician 3 days ago for similar symptoms and had ultrasound which showed going to the patient a salivary gland stone. States since that time he is able to control his secretions. No trouble swallowing. No fever or chills. Pain is worse with movement. Was sent to the ER for further evaluation today   Office note by Dr Jearld Fenton 03/05/23    Nathan Castro is an 78 y.o. male.   Chief Complaint: submandibular infection HPI: hxof swelling of the left submax gland for years. It usually swells up then resolves quickly. This time is has remained swollen. He has a pain and large swelling in left submandibular area. He was placed on clindamycin Tuesday.    Hp/Larynx- normal voice and no airway issues. No swelling or lesions Neck- left submandibular gland is swollen and painful. there was significant pus expressed from whartons duct. There was a thick paste that would ot come out. Risks benefits and optons discussed and all questions answered. The tip of whartons duct snipped off and significant exudate material expressed then significant pus and saliva. The left area decompressed and he immediately felt better. no mass or lesions. Normal movement.  Neuro- CNII-XII intact, no sensory deficits.  Lungs- normal effort no distress noted  Assessment/Plan Left submandibular sialadenitis- he will put heat on it and massage. He will take 600mg  of motrin with food TID. He will follow up in 2 weeks but sooner if he is not significantly better by early next week.      Past Medical History:  Diagnosis Date   Arthritis    rheumatoid arthritis   Bilirubinuria    Elevated PSA    Heart murmur    Hypertension    Prostate cancer (HCC)     Proteinuria     Past Surgical History:  Procedure Laterality Date   INGUINAL HERNIA REPAIR  2000   PROSTATE BIOPSY      Family History  Problem Relation Age of Onset   Breast cancer Sister    Colon cancer Brother    Prostate cancer Neg Hx    Pancreatic cancer Neg Hx     Social History:  reports that he has never smoked. He has never used smokeless tobacco. He reports that he does not drink alcohol and does not use drugs.  Allergies:  Allergies  Allergen Reactions   Percocet [Oxycodone-Acetaminophen] Nausea And Vomiting    Medications: I have reviewed the patient's current medications.  The PMH, PSH, Medications, Allergies, and SH were reviewed and updated.  ROS: Constitutional: Negative for fever, weight loss and weight gain. Cardiovascular: Negative for chest pain and dyspnea on exertion. Respiratory: Is not experiencing shortness of breath at rest. Gastrointestinal: Negative for nausea and vomiting. Neurological: Negative for headaches. Psychiatric: The patient is not nervous/anxious  Blood pressure (!) 185/79, pulse 82, SpO2 98%.  PHYSICAL EXAM:  Exam: General: Well-developed, well-nourished Respiratory Respiratory effort: Equal inspiration and expiration without stridor Cardiovascular Peripheral Vascular: Warm extremities with equal color/perfusion Eyes: No nystagmus with equal extraocular motion bilaterally Neuro/Psych/Balance: Patient oriented to person, place, and time; Appropriate mood and affect; Gait is intact with no imbalance; Cranial nerves I-XII are intact Head and Face Inspection: Normocephalic and atraumatic without mass or lesion Palpation: Facial skeleton intact without bony stepoffs Salivary Glands: normal to palpation bilaterally Facial Strength: Facial motility symmetric and full bilaterally ENT  Lips, Teeth, and gums: Mucosa and teeth intact and viable Oral cavity/oropharynx: evidence of completely healed opening in the left Wharton's  Proximal opening into the duct appears patent with drainage of saliva when left submandibular gland is compressed Neck Neck and Trachea: Midline trachea without mass or lesion Thyroid: No mass or nodularity Lymphatics: No lymphadenopathy   Studies Reviewed:CT   FINDINGS: Pharynx and larynx: Laryngeal and pharyngeal soft tissue contours are within normal limits. Negative parapharyngeal and retropharyngeal spaces.   Salivary glands: Palpable area of concern marked on series 2, image 64 corresponding to the left submandibular gland. The gland is asymmetrically enlarged and heterogeneous. However, branching hypodensity within the gland is consistent with dilated intraglandular ducts. And at the left S mg hilum the main duct is dilated nearly 9 mm diameter. Downstream of that in the left submandibular space there is a large oval 14 mm long by nearly 10 mm diameter left submandibular duct sialolith (series 2, image 51) with a smaller but still 3-4 mm sallow lith just posterior to the dominant stone.   No definite active inflammation superimposed in either the left submandibular or the left sublingual space.   The sublingual glands are diminutive. The contralateral right submandibular gland appears more normal, with only mild ductal ectasia. There is punctate left parotid sialolithiasis inferiorly. Otherwise both parotid glands appear normal.   Thyroid: Negative.   Lymph nodes: Negative.  No cervical lymphadenopathy.   Vascular: Major vascular structures in the neck and at the skull base are patent. Mild arterial tortuosity but minimal for age atherosclerosis in the neck. Left vertebral artery appears to be dominant, normal variant.   Limited intracranial: Negative.   Visualized orbits: Negative.   Mastoids and visualized paranasal sinuses: Clear.   Skeleton: No acute dental finding. Torus mandibularis, normal variant. Age-appropriate cervical spine degeneration.  Normal background bone mineralization. No acute or suspicious osseous lesion identified.   Upper chest: Negative visible superior mediastinum aside from mild tortuosity of the aortic arch. Mild upper lung atelectasis, subpleural scarring.   IMPRESSION: 1. Bulky 14 x 10 mm obstructing salivary stone in the anterior left main submandibular duct is associated with severe ductal enlargement at the hilum and within the parenchyma of the left submandibular gland, causing asymmetric gland enlargement at the palpable area of concern. But no definite active inflammation. And there is a smaller 3-4 mm sialolith just upstream of the dominant stone.   2. Minimal other sialolithiasis (left parotid). No other neck mass. No lymphadenopathy. No other acute or inflammatory process identified in the Neck.    Assessment/Plan: Encounter Diagnoses  Name Primary?   Salivary duct stone Yes   Chronic sialoadenitis    Submandibular swelling        Left submandibular swelling and pain, recent episode of bacterial sialadenitis on the left side involving left submandibular gland.  He underwent drainage of purulent contents by Dr. Jearld Fenton during his follow-up with him 03/05/2023, with symptom relief at the time.  He also previously was treated with a course of clindamycin.   -Here for 2-week follow-up and initial evaluation with me.  No evidence of persistent purulent drainage from [facility] duct today on exam, no floor mouth swelling, no palpable stone.  Left submandibular gland appears to be enlarged still, but there was no tenderness today.  He had no prior imaging of his neck and due to persistent swelling in the left submandibular region, we will obtain CT neck to rule out stones or neoplasm.  -I discussed the importance of continuing supportive care for sialadenitis including sialagogues, warm compresses, good hydration, gentle gland massage.  -I discussed that his symptoms could recur or worsen, and  advised him to have a prescription for Augmentin and steroid pack on hand should that occur.  He will only take it if his symptoms worsen or persist.   -He will return after imaging in a few weeks for repeat exam and imaging results review Thank you for allowing me to participate in the care of this patient. Please do not hesitate to contact me with any questions or concerns.   Update 05/06/23 had another episode of left submandibular gland swelling which responded to a course of Augmentin.  CT neck which I reviewed with the patient today shows a large left Wharton duct stone along the floor mouth and obstructive stasis of saliva and left submandibular gland.   Chronic left submandibular gland sialoadenitis and large salivary duct stone with stasis of saliva -We discussed management options including observation and medical management as needed for recurrent episodes of pain and swelling versus transoral excision and removal of salivary duct stone with sialodochoplasty -I discussed risks and benefits of surgical procedure and he would like to proceed to avoid any further episodes of sialoadenitis - No history of stroke or heart attack, and not on blood thinner -Will proceed with surgery  Update 07/08/23 s/p left submandibular glad duct stone removal and  sialodochoplasty with stent placement, did well post-op. Proximal duct opening appears patent. Sutures removed.   - hydration and sialogogues  - gentle gland massage - return if experience any recurrent left submandibular gland swelling  Update 07/22/23 Surgical opening of the left Wharton's duct patent on exam today. Able to express saliva when left submandibular gland compressed. Patient without pain.  No evidence of purulent secretions expressed.   - will monitor for now - hydration and sialogogues  - gentle gland massage - RTC 4 weeks for repeat exam  Ashok Croon, MD Otolaryngology [facility] Phone:  [phone] Fax: [phone]    07/22/2023, 1:39 PM

## 2023-08-19 ENCOUNTER — Ambulatory Visit (INDEPENDENT_AMBULATORY_CARE_PROVIDER_SITE_OTHER): Payer: Medicare Other | Admitting: Otolaryngology

## 2023-08-19 ENCOUNTER — Encounter (INDEPENDENT_AMBULATORY_CARE_PROVIDER_SITE_OTHER): Payer: Self-pay | Admitting: Otolaryngology

## 2023-08-19 VITALS — BP 161/78 | HR 84

## 2023-08-19 DIAGNOSIS — K115 Sialolithiasis: Secondary | ICD-10-CM

## 2023-08-19 DIAGNOSIS — K1123 Chronic sialoadenitis: Secondary | ICD-10-CM

## 2023-08-19 DIAGNOSIS — Z9889 Other specified postprocedural states: Secondary | ICD-10-CM

## 2023-08-19 NOTE — Progress Notes (Signed)
ENT Progress Note:  Update 08/19/2023  Discussed the use of AI scribe software for clinical note transcription with the patient, who gave verbal consent to proceed.  History of Present Illness   Nathan Castro is a 78 year old male who presents for follow-up of salivary gland inflammation. Had trans-oral excision of left submandibular gland duct stone a couple of months ago, and had an episode of left SMG swelling during last encounter. He has been massaging the gland and using warm compresses, which he finds beneficial. He has not experienced any recent problems with the gland, and the swelling has resolved.  Records Reviewed:  Initial Evaluation  Update 07/22/23 he noticed some swelling around left submandibular gland. No pain. Reports feeling a "knot" along the right floor of   Update 07/08/23:  Discussed the use of AI scribe software for clinical note transcription with the patient, who gave verbal consent to proceed.  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 They report feeling great with no pain. They noticed a small piece of what they believed to be suture material in their mouth post-procedure, but this did not cause any discomfort. The salivary stent fell out 3 days after the surgery. They have been able to eat and swallow well, indicating good salivary flow. The patient was on an antibiotic regimen and finished it. They have not experienced any swelling or infection since the procedure.     Update 05/06/2023: He returns after CT neck which revealed large left submandibular duct stone and obstructive stasis of saliva in the left submandibular gland, but no masses or lesions and no lymphadenopathy.  Since his last office visit he had 1 more episode of swelling and pain along the left submandibular gland.  He took Augmentin I prescribed, and symptoms improved.   He did not take steroids.   Initial evaluation 03/19/2023 HPI: Nathan Castro is an 78 y.o. male with hx of recent swelling in the area of left submandibular gland who was seen in the ER and subsequently was evaluated by Dr. Jearld Fenton in the office here, who presents for initial evaluation with me to follow-up on his symptoms.  He reports that he developed significant pain and swelling around the left submandibular region approximately 6 to 7 weeks ago. He went to ED with severe pain and swelling in the area of the left submandibular gland. The pain was radiating up his jaw. It was painful to swallow at the time. He had to spit up secretions that day.  He did not have imaging but was diagnosed with sialoadenitis and prescribed Clindamycin. He then saw Dr Jearld Fenton 03/04/33   Records Reviewed:  ED note 03/01/23 78 year old male presents with several days of left submandibular swelling. Patient saw his physician 3 days ago for similar symptoms and had ultrasound which showed going to the patient a salivary gland stone. States since that time he is able to control his secretions. No trouble swallowing. No fever or chills. Pain is worse with movement. Was sent to the ER for further evaluation today   Office note by Dr Jearld Fenton 03/05/23    Nathan Castro is an 78 y.o. male.   Chief Complaint: submandibular infection HPI: hxof swelling of the left submax gland for years. It usually swells up then resolves quickly. This time is has remained swollen. He has a pain and large swelling in left submandibular area.  He was placed on clindamycin Tuesday.    Hp/Larynx- normal voice and no airway issues. No swelling or lesions Neck- left submandibular gland is swollen and painful. there was significant pus expressed from whartons duct. There was a thick paste that would ot come out. Risks benefits and optons discussed and all questions answered. The tip of whartons duct snipped off and significant exudate material expressed then  significant pus and saliva. The left area decompressed and he immediately felt better. no mass or lesions. Normal movement.  Neuro- CNII-XII intact, no sensory deficits.  Lungs- normal effort no distress noted       Assessment/Plan Left submandibular sialadenitis- he will put heat on it and massage. He will take 600mg  of motrin with food TID. He will follow up in 2 weeks but sooner if he is not significantly better by early next week.      Past Medical History:  Diagnosis Date   Arthritis    rheumatoid arthritis   Bilirubinuria    Elevated PSA    Heart murmur    Hypertension    Prostate cancer (HCC)    Proteinuria     Past Surgical History:  Procedure Laterality Date   INGUINAL HERNIA REPAIR  2000   PROSTATE BIOPSY      Family History  Problem Relation Age of Onset   Breast cancer Sister    Colon cancer Brother    Prostate cancer Neg Hx    Pancreatic cancer Neg Hx     Social History:  reports that he has never smoked. He has never used smokeless tobacco. He reports that he does not drink alcohol and does not use drugs.  Allergies:  Allergies  Allergen Reactions   Percocet [Oxycodone-Acetaminophen] Nausea And Vomiting    Medications: I have reviewed the patient's current medications.  The PMH, PSH, Medications, Allergies, and SH were reviewed and updated.  ROS: Constitutional: Negative for fever, weight loss and weight gain. Cardiovascular: Negative for chest pain and dyspnea on exertion. Respiratory: Is not experiencing shortness of breath at rest. Gastrointestinal: Negative for nausea and vomiting. Neurological: Negative for headaches. Psychiatric: The patient is not nervous/anxious  Blood pressure (!) 161/78, pulse 84, SpO2 99%.  PHYSICAL EXAM:  Exam: General: Well-developed, well-nourished Respiratory Respiratory effort: Equal inspiration and expiration without stridor Cardiovascular Peripheral Vascular: Warm extremities with equal  color/perfusion Eyes: No nystagmus with equal extraocular motion bilaterally Neuro/Psych/Balance: Patient oriented to person, place, and time; Appropriate mood and affect; Gait is intact with no imbalance; Cranial nerves I-XII are intact Head and Face Inspection: Normocephalic and atraumatic without mass or lesion Palpation: Facial skeleton intact without bony stepoffs Salivary Glands: normal to palpation bilaterally Facial Strength: Facial motility symmetric and full bilaterally ENT  Lips, Teeth, and gums: Mucosa and teeth intact and viable Oral cavity/oropharynx: evidence of completely healed opening in the left Wharton's Proximal opening into the duct appears patent with drainage of clear saliva when left submandibular gland is compressed Neck Neck and Trachea: Midline trachea without mass or lesion Thyroid: No mass or nodularity Lymphatics: No lymphadenopathy   Studies Reviewed:CT   FINDINGS: Pharynx and larynx: Laryngeal and pharyngeal soft tissue contours are within normal limits. Negative parapharyngeal and retropharyngeal spaces.   Salivary glands: Palpable area of concern marked on series 2, image 64 corresponding to the left submandibular gland. The gland is asymmetrically enlarged and heterogeneous. However, branching hypodensity within the gland is consistent with dilated intraglandular ducts. And at the left S mg hilum the main duct is  dilated nearly 9 mm diameter. Downstream of that in the left submandibular space there is a large oval 14 mm long by nearly 10 mm diameter left submandibular duct sialolith (series 2, image 51) with a smaller but still 3-4 mm sallow lith just posterior to the dominant stone.   No definite active inflammation superimposed in either the left submandibular or the left sublingual space.   The sublingual glands are diminutive. The contralateral right submandibular gland appears more normal, with only mild ductal ectasia. There is  punctate left parotid sialolithiasis inferiorly. Otherwise both parotid glands appear normal.   Thyroid: Negative.   Lymph nodes: Negative.  No cervical lymphadenopathy.   Vascular: Major vascular structures in the neck and at the skull base are patent. Mild arterial tortuosity but minimal for age atherosclerosis in the neck. Left vertebral artery appears to be dominant, normal variant.   Limited intracranial: Negative.   Visualized orbits: Negative.   Mastoids and visualized paranasal sinuses: Clear.   Skeleton: No acute dental finding. Torus mandibularis, normal variant. Age-appropriate cervical spine degeneration. Normal background bone mineralization. No acute or suspicious osseous lesion identified.   Upper chest: Negative visible superior mediastinum aside from mild tortuosity of the aortic arch. Mild upper lung atelectasis, subpleural scarring.   IMPRESSION: 1. Bulky 14 x 10 mm obstructing salivary stone in the anterior left main submandibular duct is associated with severe ductal enlargement at the hilum and within the parenchyma of the left submandibular gland, causing asymmetric gland enlargement at the palpable area of concern. But no definite active inflammation. And there is a smaller 3-4 mm sialolith just upstream of the dominant stone.   2. Minimal other sialolithiasis (left parotid). No other neck mass. No lymphadenopathy. No other acute or inflammatory process identified in the Neck.    Assessment/Plan: Encounter Diagnoses  Name Primary?   Salivary duct stone Yes   Chronic sialoadenitis     Left submandibular swelling and pain, recent episode of bacterial sialadenitis on the left side involving left submandibular gland.  He underwent drainage of purulent contents by Dr. Jearld Fenton during his follow-up with him 03/05/2023, with symptom relief at the time.  He also previously was treated with a course of clindamycin.   -Here for 2-week follow-up and initial  evaluation with me.  No evidence of persistent purulent drainage from [facility] duct today on exam, no floor mouth swelling, no palpable stone.  Left submandibular gland appears to be enlarged still, but there was no tenderness today.  He had no prior imaging of his neck and due to persistent swelling in the left submandibular region, we will obtain CT neck to rule out stones or neoplasm.  -I discussed the importance of continuing supportive care for sialadenitis including sialagogues, warm compresses, good hydration, gentle gland massage.  -I discussed that his symptoms could recur or worsen, and advised him to have a prescription for Augmentin and steroid pack on hand should that occur.  He will only take it if his symptoms worsen or persist.   -He will return after imaging in a few weeks for repeat exam and imaging results review Thank you for allowing me to participate in the care of this patient. Please do not hesitate to contact me with any questions or concerns.   Update 05/06/23 had another episode of left submandibular gland swelling which responded to a course of Augmentin.  CT neck which I reviewed with the patient today shows a large left Wharton duct stone along the floor mouth and obstructive  stasis of saliva and left submandibular gland.   Chronic left submandibular gland sialoadenitis and large salivary duct stone with stasis of saliva -We discussed management options including observation and medical management as needed for recurrent episodes of pain and swelling versus transoral excision and removal of salivary duct stone with sialodochoplasty -I discussed risks and benefits of surgical procedure and he would like to proceed to avoid any further episodes of sialoadenitis - No history of stroke or heart attack, and not on blood thinner -Will proceed with surgery  Update 07/08/23 s/p left submandibular glad duct stone removal and sialodochoplasty with stent placement, did well post-op.  Proximal duct opening appears patent. Sutures removed.   - hydration and sialogogues  - gentle gland massage - return if experience any recurrent left submandibular gland swelling  Update 07/22/23 Surgical opening of the left Wharton's duct patent on exam today. Able to express saliva when left submandibular gland compressed. Patient without pain.  No evidence of purulent secretions expressed.   - will monitor for now - hydration and sialogogues  - gentle gland massage - RTC 4 weeks for repeat exam  Update 08/19/2023 Assessment and Plan    Salivary Gland Inflammation - resolved  Resolved salivary gland inflammation likely due to dehydration and thick saliva obstruction. Gland no longer swollen, opening in the duct appears normal. Discussed hydration to prevent recurrence and methods to stimulate saliva flow. - Encourage adequate hydration - Advise massaging the gland - Recommend warm compresses - Suggest lemon cough drops - Advise follow-up if swelling recurs  Follow-up - Schedule follow-up for recurrent salivary gland issues or other ENT concerns - Wife to call for an appointment for allergy and sinus issues.         Ashok Croon, MD Otolaryngology [facility] Phone: [phone] Fax: [phone]    08/19/2023, 4:45 PM

## 2024-04-12 DIAGNOSIS — E785 Hyperlipidemia, unspecified: Secondary | ICD-10-CM | POA: Insufficient documentation

## 2024-04-12 NOTE — H&P (View-Only) (Signed)
 Cardiology Office Note   Date:  04/13/2024   ID:  Nathan Castro, DOB [birth year], MRN [id number]  PCP:  Melba Lamarr BRAVO, NP  Cardiologist:   None Referring:  Melba Lamarr BRAVO, NP  No chief complaint on file.     History of Present Illness: Nathan Castro is a 78 y.o. male who presents for evaluation of an abnormal EKG.  He reports that he has had no past cardiac history but he really does not go to the doctor very frequently.  He said that for months he has been having mid chest discomfort.  It comes and goes spontaneously.  It happens at rest.  It is 5-7 out of 10 in intensity and lasts for minutes at a time.  It is substernal and radiates slightly to the left.  He had it today in our waiting area.  He actually felt presyncopal.  He is not having any acute nausea vomiting or diaphoresis with this.  He does not have radiation to his left arm or his jaw.  He not had this before.  He does not describe PND or orthopnea.  He has had decreased exercise tolerance over about a year and a half.  He is felt more fatigued.  He has felt more short of breath during activity such as climbing the stairs.  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  He does have renal insufficiency that is followed.  He was seen at [facility].  He is found to have a left bundle branch block and is referred here.  I did review their most recent notes.  He is followed for dyslipidemia and hypertension.  He is have prostate cancer he said he is cancer free.  He does report a chest x-ray though I do not see this result.   Past Medical History:  Diagnosis Date   Anemia    Arthritis    rheumatoid arthritis   Arthropathy    Barrett esophagus    Bilirubinuria    CKD (chronic kidney disease), stage III (HCC)    Elevated blood pressure reading    Elevated PSA    Gastric polyps    H. pylori infection    Hypertension    Hypertriglyceridemia    Iron deficiency    Osteopenia     Prostate cancer (HCC)    Proteinuria    RBC abnormality    Sialadenitis    Submandibular lymphadenitis    Vertigo     Past Surgical History:  Procedure Laterality Date   INGUINAL HERNIA REPAIR  2000   PROSTATE BIOPSY       Current Outpatient Medications  Medication Sig Dispense Refill   amLODipine -benazepril  (LOTREL) 10-40 MG capsule Take 1 capsule by mouth daily. 30 capsule 5   Cholecalciferol (VITAMIN D3) 1.25 MG (50000 UT) CAPS Take by mouth.     ferrous sulfate 325 (65 FE) MG tablet Take 325 mg by mouth daily with breakfast.     Multiple Vitamin (MULTIVITAMIN) capsule Take 1 capsule by mouth daily.     Omega-3 Fatty Acids (FISH OIL) 1000 MG CAPS Take by mouth.     Acetaminophen  (TYLENOL  8 HOUR PO) Take by mouth. (Patient not taking: Reported on 04/13/2024)     Ascorbic Acid (VITAMIN C PO) Take by mouth. (Patient not taking: Reported on 04/13/2024)     Garlic 1000 MG CAPS Take by mouth. (Patient not taking: Reported on 04/13/2024)     gatifloxacin (ZYMAXID)  0.5 % SOLN  (Patient not taking: Reported on 04/13/2024)     ibuprofen (ADVIL) 100 MG tablet Take 100 mg by mouth every 6 (six) hours as needed for fever. (Patient not taking: Reported on 04/13/2024)     methylPREDNISolone  (MEDROL  DOSEPAK) 4 MG TBPK tablet Take post-op to decrease swelling (Patient not taking: Reported on 04/13/2024) 1 each 1   Multiple Vitamins-Minerals (ZINC PO) Take by mouth. (Patient not taking: Reported on 04/13/2024)     Omega-3 Fatty Acids (FISH OIL PO) Take by mouth. (Patient not taking: Reported on 04/13/2024)     oxyCODONE  (ROXICODONE ) 5 MG immediate release tablet Take 1 tablet (5 mg total) by mouth every 6 (six) hours as needed for severe pain (pain score 7-10). (Patient not taking: Reported on 04/13/2024) 20 tablet 0   phenazopyridine  (PYRIDIUM ) 100 MG tablet Take 1 tablet (100 mg total) by mouth 3 (three) times daily as needed for pain. (Patient not taking: Reported on 04/13/2024) 25 tablet 0   sildenafil  (VIAGRA) 100 MG tablet Take 100 mg by mouth daily as needed for erectile dysfunction. (Patient not taking: Reported on 04/13/2024)     tamsulosin  (FLOMAX ) 0.4 MG CAPS capsule Take 1 capsule (0.4 mg total) by mouth at bedtime. (Patient not taking: Reported on 04/13/2024) 30 capsule 0   No current facility-administered medications for this visit.    Allergies:   Percocet [oxycodone -acetaminophen ]    Social History:  The patient  reports that he has never smoked. He has never used smokeless tobacco. He reports that he does not drink alcohol and does not use drugs.   Family History:  The patient's family history includes Breast cancer in his sister; Colon cancer in his brother.    ROS:  Please see the history of present illness.   Otherwise, review of systems are positive for none.   All other systems are reviewed and negative.    PHYSICAL EXAM: VS:  BP (!) 180/70   Pulse 71   Ht 6' (1.829 m)   Wt 171 lb (77.6 kg)   SpO2 99%   BMI 23.19 kg/m  , BMI Body mass index is 23.19 kg/m. GENERAL:  Well appearing HEENT:  Pupils equal round and reactive, fundi not visualized, oral mucosa unremarkable NECK:  No jugular venous distention, waveform within normal limits, carotid upstroke brisk and symmetric, no bruits, no thyromegaly LYMPHATICS:  No cervical, inguinal adenopathy LUNGS:  Clear to auscultation bilaterally BACK:  No CVA tenderness CHEST:  Unremarkable HEART:  PMI not displaced or sustained,S1 and S2 within normal limits, no S3, no S4, no clicks, no rubs, 3 out of 6 systolic murmur radiating out the aortic outflow tract and into the carotids, 2 out of 6 diastolic murmur heard at the third left intercostal space murmurs ABD:  Flat, positive bowel sounds normal in frequency in pitch, no bruits, no rebound, no guarding, no midline pulsatile mass, no hepatomegaly, no splenomegaly EXT:  2 plus pulses throughout, no edema, no cyanosis no clubbing SKIN:  No rashes no nodules NEURO:  Cranial  nerves II through XII grossly intact, motor grossly intact throughout [facility]:  Cognitively intact, oriented to person place and time    EKG:  EKG Interpretation Date/Time:  Thursday April 13 2024 11:31:12 EDT Ventricular Rate:  71 PR Interval:  196 QRS Duration:  186 QT Interval:  464 QTC Calculation: 504 R Axis:   -60  Text Interpretation: Normal sinus rhythm Possible Left atrial enlargement Left axis deviation Left bundle branch block No  previous ECGs available Confirmed by Lavona Agent ([id number]) on 04/13/2024 11:39:06 AM     Recent Labs: No results found for requested labs within last 365 days.    Lipid Panel No results found for: CHOL, TRIG, HDL, CHOLHDL, VLDL, LDLCALC, LDLDIRECT    Wt Readings from Last 3 Encounters:  04/13/24 171 lb (77.6 kg)  03/19/23 179 lb 3.2 oz (81.3 kg)  03/05/23 168 lb (76.2 kg)      Other studies Reviewed: Additional studies/ records that were reviewed today include: Primary care office records. Review of the above records demonstrates:  Please see elsewhere in the note.     ASSESSMENT AND PLAN:   Abnormal EKG: The patient has a left bundle branch block with no EKGs for comparison.  This will be managed as below.  Chest pain: He is having resting chest discomfort but certainly could be unstable angina.  He needs to be admitted to the hospital through the emergency room and we will call EMS.  My team has been notified for further workup which will include cycling enzymes and consideration of cardiac cath electively pending his blood work.    CKD: He has a history of CKD.  I do not know the etiology other than hypertension.  This will need to be followed closely avoiding nephrotoxic agents.  HTN: His blood pressure is not at target and will be treated in the context of managing his chest pain.  Dyslipidemia: No need risk reduction lipid profile and therapy based on these results.  Murmur: He has aortic stenosis and aortic  insufficiency by exam.  Needs echocardiography to quantify.    Current medicines are reviewed at length with the patient today.  The patient does not have concerns regarding medicines.  The following changes have been made:  no change  Labs/ tests ordered today include:   Orders Placed This Encounter  Procedures   EKG 12-Lead   EKG 12-Lead     Disposition:   FU with with me after the hospitalization.     Signed, Agent Lavona, MD  04/13/2024 11:57 AM    [facility]

## 2024-04-12 NOTE — Progress Notes (Unsigned)
 Cardiology Office Note   Date:  04/13/2024   ID:  Nathan Castro, DOB [birth year], MRN [id number]  PCP:  Nathan Lamarr BRAVO, NP  Cardiologist:   None Referring:  Nathan Lamarr BRAVO, NP  No chief complaint on file.     History of Present Illness: Nathan Castro is a 78 y.o. male who presents for evaluation of an abnormal EKG.  He reports that he has had no past cardiac history but he really does not go to the doctor very frequently.  He said that for months he has been having mid chest discomfort.  It comes and goes spontaneously.  It happens at rest.  It is 5-7 out of 10 in intensity and lasts for minutes at a time.  It is substernal and radiates slightly to the left.  He had it today in our waiting area.  He actually felt presyncopal.  He is not having any acute nausea vomiting or diaphoresis with this.  He does not have radiation to his left arm or his jaw.  He not had this before.  He does not describe PND or orthopnea.  He has had decreased exercise tolerance over about a year and a half.  He is felt more fatigued.  He has felt more short of breath during activity such as climbing the stairs.  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  He does have renal insufficiency that is followed.  He was seen at [facility].  He is found to have a left bundle branch block and is referred here.  I did review their most recent notes.  He is followed for dyslipidemia and hypertension.  He is have prostate cancer he said he is cancer free.  He does report a chest x-ray though I do not see this result.   Past Medical History:  Diagnosis Date   Anemia    Arthritis    rheumatoid arthritis   Arthropathy    Barrett esophagus    Bilirubinuria    CKD (chronic kidney disease), stage III (HCC)    Elevated blood pressure reading    Elevated PSA    Gastric polyps    H. pylori infection    Hypertension    Hypertriglyceridemia    Iron deficiency    Osteopenia     Prostate cancer (HCC)    Proteinuria    RBC abnormality    Sialadenitis    Submandibular lymphadenitis    Vertigo     Past Surgical History:  Procedure Laterality Date   INGUINAL HERNIA REPAIR  2000   PROSTATE BIOPSY       Current Outpatient Medications  Medication Sig Dispense Refill   amLODipine -benazepril  (LOTREL) 10-40 MG capsule Take 1 capsule by mouth daily. 30 capsule 5   Cholecalciferol (VITAMIN D3) 1.25 MG (50000 UT) CAPS Take by mouth.     ferrous sulfate 325 (65 FE) MG tablet Take 325 mg by mouth daily with breakfast.     Multiple Vitamin (MULTIVITAMIN) capsule Take 1 capsule by mouth daily.     Omega-3 Fatty Acids (FISH OIL) 1000 MG CAPS Take by mouth.     Acetaminophen  (TYLENOL  8 HOUR PO) Take by mouth. (Patient not taking: Reported on 04/13/2024)     Ascorbic Acid (VITAMIN C PO) Take by mouth. (Patient not taking: Reported on 04/13/2024)     Garlic 1000 MG CAPS Take by mouth. (Patient not taking: Reported on 04/13/2024)     gatifloxacin (ZYMAXID)  0.5 % SOLN  (Patient not taking: Reported on 04/13/2024)     ibuprofen (ADVIL) 100 MG tablet Take 100 mg by mouth every 6 (six) hours as needed for fever. (Patient not taking: Reported on 04/13/2024)     methylPREDNISolone  (MEDROL  DOSEPAK) 4 MG TBPK tablet Take post-op to decrease swelling (Patient not taking: Reported on 04/13/2024) 1 each 1   Multiple Vitamins-Minerals (ZINC PO) Take by mouth. (Patient not taking: Reported on 04/13/2024)     Omega-3 Fatty Acids (FISH OIL PO) Take by mouth. (Patient not taking: Reported on 04/13/2024)     oxyCODONE  (ROXICODONE ) 5 MG immediate release tablet Take 1 tablet (5 mg total) by mouth every 6 (six) hours as needed for severe pain (pain score 7-10). (Patient not taking: Reported on 04/13/2024) 20 tablet 0   phenazopyridine  (PYRIDIUM ) 100 MG tablet Take 1 tablet (100 mg total) by mouth 3 (three) times daily as needed for pain. (Patient not taking: Reported on 04/13/2024) 25 tablet 0   sildenafil  (VIAGRA) 100 MG tablet Take 100 mg by mouth daily as needed for erectile dysfunction. (Patient not taking: Reported on 04/13/2024)     tamsulosin  (FLOMAX ) 0.4 MG CAPS capsule Take 1 capsule (0.4 mg total) by mouth at bedtime. (Patient not taking: Reported on 04/13/2024) 30 capsule 0   No current facility-administered medications for this visit.    Allergies:   Percocet [oxycodone -acetaminophen ]    Social History:  The patient  reports that he has never smoked. He has never used smokeless tobacco. He reports that he does not drink alcohol and does not use drugs.   Family History:  The patient's family history includes Breast cancer in his sister; Colon cancer in his brother.    ROS:  Please see the history of present illness.   Otherwise, review of systems are positive for none.   All other systems are reviewed and negative.    PHYSICAL EXAM: VS:  BP (!) 180/70   Pulse 71   Ht 6' (1.829 m)   Wt 171 lb (77.6 kg)   SpO2 99%   BMI 23.19 kg/m  , BMI Body mass index is 23.19 kg/m. GENERAL:  Well appearing HEENT:  Pupils equal round and reactive, fundi not visualized, oral mucosa unremarkable NECK:  No jugular venous distention, waveform within normal limits, carotid upstroke brisk and symmetric, no bruits, no thyromegaly LYMPHATICS:  No cervical, inguinal adenopathy LUNGS:  Clear to auscultation bilaterally BACK:  No CVA tenderness CHEST:  Unremarkable HEART:  PMI not displaced or sustained,S1 and S2 within normal limits, no S3, no S4, no clicks, no rubs, 3 out of 6 systolic murmur radiating out the aortic outflow tract and into the carotids, 2 out of 6 diastolic murmur heard at the third left intercostal space murmurs ABD:  Flat, positive bowel sounds normal in frequency in pitch, no bruits, no rebound, no guarding, no midline pulsatile mass, no hepatomegaly, no splenomegaly EXT:  2 plus pulses throughout, no edema, no cyanosis no clubbing SKIN:  No rashes no nodules NEURO:  Cranial  nerves II through XII grossly intact, motor grossly intact throughout [facility]:  Cognitively intact, oriented to person place and time    EKG:  EKG Interpretation Date/Time:  Thursday April 13 2024 11:31:12 EDT Ventricular Rate:  71 PR Interval:  196 QRS Duration:  186 QT Interval:  464 QTC Calculation: 504 R Axis:   -60  Text Interpretation: Normal sinus rhythm Possible Left atrial enlargement Left axis deviation Left bundle branch block No  previous ECGs available Confirmed by Lavona Agent ([id number]) on 04/13/2024 11:39:06 AM     Recent Labs: No results found for requested labs within last 365 days.    Lipid Panel No results found for: CHOL, TRIG, HDL, CHOLHDL, VLDL, LDLCALC, LDLDIRECT    Wt Readings from Last 3 Encounters:  04/13/24 171 lb (77.6 kg)  03/19/23 179 lb 3.2 oz (81.3 kg)  03/05/23 168 lb (76.2 kg)      Other studies Reviewed: Additional studies/ records that were reviewed today include: Primary care office records. Review of the above records demonstrates:  Please see elsewhere in the note.     ASSESSMENT AND PLAN:   Abnormal EKG: The patient has a left bundle branch block with no EKGs for comparison.  This will be managed as below.  Chest pain: He is having resting chest discomfort but certainly could be unstable angina.  He needs to be admitted to the hospital through the emergency room and we will call EMS.  My team has been notified for further workup which will include cycling enzymes and consideration of cardiac cath electively pending his blood work.    CKD: He has a history of CKD.  I do not know the etiology other than hypertension.  This will need to be followed closely avoiding nephrotoxic agents.  HTN: His blood pressure is not at target and will be treated in the context of managing his chest pain.  Dyslipidemia: No need risk reduction lipid profile and therapy based on these results.  Murmur: He has aortic stenosis and aortic  insufficiency by exam.  Needs echocardiography to quantify.    Current medicines are reviewed at length with the patient today.  The patient does not have concerns regarding medicines.  The following changes have been made:  no change  Labs/ tests ordered today include:   Orders Placed This Encounter  Procedures   EKG 12-Lead   EKG 12-Lead     Disposition:   FU with with me after the hospitalization.     Signed, Agent Lavona, MD  04/13/2024 11:57 AM    [facility]

## 2024-04-13 ENCOUNTER — Emergency Department (HOSPITAL_COMMUNITY)

## 2024-04-13 ENCOUNTER — Ambulatory Visit (INDEPENDENT_AMBULATORY_CARE_PROVIDER_SITE_OTHER): Admitting: Cardiology

## 2024-04-13 ENCOUNTER — Inpatient Hospital Stay (HOSPITAL_COMMUNITY)

## 2024-04-13 ENCOUNTER — Inpatient Hospital Stay (HOSPITAL_COMMUNITY)
Admission: EM | Admit: 2024-04-13 | Discharge: 2024-04-15 | DRG: 286 | Disposition: A | Discharge status: Home / Self Care | Source: Ambulatory Visit | Attending: Cardiovascular Disease | Admitting: Cardiovascular Disease

## 2024-04-13 ENCOUNTER — Encounter: Payer: Self-pay | Admitting: Cardiology

## 2024-04-13 ENCOUNTER — Other Ambulatory Visit: Payer: Self-pay

## 2024-04-13 ENCOUNTER — Encounter (HOSPITAL_COMMUNITY): Payer: Self-pay | Admitting: Emergency Medicine

## 2024-04-13 VITALS — BP 180/70 | HR 71 | Ht 72.0 in | Wt 171.0 lb

## 2024-04-13 DIAGNOSIS — I5021 Acute systolic (congestive) heart failure: Secondary | ICD-10-CM | POA: Diagnosis present

## 2024-04-13 DIAGNOSIS — E781 Pure hyperglyceridemia: Secondary | ICD-10-CM | POA: Diagnosis present

## 2024-04-13 DIAGNOSIS — Z7982 Long term (current) use of aspirin: Secondary | ICD-10-CM

## 2024-04-13 DIAGNOSIS — I130 Hypertensive heart and chronic kidney disease with heart failure and stage 1 through stage 4 chronic kidney disease, or unspecified chronic kidney disease: Principal | ICD-10-CM | POA: Diagnosis present

## 2024-04-13 DIAGNOSIS — Z803 Family history of malignant neoplasm of breast: Secondary | ICD-10-CM | POA: Diagnosis not present

## 2024-04-13 DIAGNOSIS — I10 Essential (primary) hypertension: Secondary | ICD-10-CM | POA: Diagnosis not present

## 2024-04-13 DIAGNOSIS — I350 Nonrheumatic aortic (valve) stenosis: Secondary | ICD-10-CM

## 2024-04-13 DIAGNOSIS — N1832 Chronic kidney disease, stage 3b: Secondary | ICD-10-CM | POA: Diagnosis present

## 2024-04-13 DIAGNOSIS — I2510 Atherosclerotic heart disease of native coronary artery without angina pectoris: Secondary | ICD-10-CM | POA: Diagnosis present

## 2024-04-13 DIAGNOSIS — I447 Left bundle-branch block, unspecified: Secondary | ICD-10-CM | POA: Diagnosis present

## 2024-04-13 DIAGNOSIS — I160 Hypertensive urgency: Secondary | ICD-10-CM | POA: Diagnosis not present

## 2024-04-13 DIAGNOSIS — Z800 Family history of malignant neoplasm of digestive organs: Secondary | ICD-10-CM | POA: Diagnosis not present

## 2024-04-13 DIAGNOSIS — R9431 Abnormal electrocardiogram [ECG] [EKG]: Secondary | ICD-10-CM | POA: Diagnosis not present

## 2024-04-13 DIAGNOSIS — I77810 Thoracic aortic ectasia: Secondary | ICD-10-CM | POA: Diagnosis present

## 2024-04-13 DIAGNOSIS — I352 Nonrheumatic aortic (valve) stenosis with insufficiency: Secondary | ICD-10-CM | POA: Diagnosis present

## 2024-04-13 DIAGNOSIS — I5023 Acute on chronic systolic (congestive) heart failure: Secondary | ICD-10-CM | POA: Diagnosis present

## 2024-04-13 DIAGNOSIS — M069 Rheumatoid arthritis, unspecified: Secondary | ICD-10-CM | POA: Diagnosis present

## 2024-04-13 DIAGNOSIS — I200 Unstable angina: Secondary | ICD-10-CM

## 2024-04-13 DIAGNOSIS — Z8546 Personal history of malignant neoplasm of prostate: Secondary | ICD-10-CM | POA: Diagnosis not present

## 2024-04-13 DIAGNOSIS — I5042 Chronic combined systolic (congestive) and diastolic (congestive) heart failure: Secondary | ICD-10-CM | POA: Diagnosis not present

## 2024-04-13 DIAGNOSIS — Z79899 Other long term (current) drug therapy: Secondary | ICD-10-CM

## 2024-04-13 DIAGNOSIS — D631 Anemia in chronic kidney disease: Secondary | ICD-10-CM | POA: Diagnosis present

## 2024-04-13 DIAGNOSIS — E785 Hyperlipidemia, unspecified: Secondary | ICD-10-CM

## 2024-04-13 DIAGNOSIS — I429 Cardiomyopathy, unspecified: Secondary | ICD-10-CM | POA: Diagnosis present

## 2024-04-13 DIAGNOSIS — I351 Nonrheumatic aortic (valve) insufficiency: Secondary | ICD-10-CM | POA: Diagnosis not present

## 2024-04-13 LAB — CBC
HCT: 34.5 % — ABNORMAL LOW (ref 39.0–52.0)
HCT: 32.1 % — ABNORMAL LOW (ref 39.0–52.0)
Hemoglobin: 11 g/dL — ABNORMAL LOW (ref 13.0–17.0)
Hemoglobin: 10.5 g/dL — ABNORMAL LOW (ref 13.0–17.0)
MCH: 26 pg (ref 26.0–34.0)
MCH: 26.3 pg (ref 26.0–34.0)
MCHC: 31.9 g/dL (ref 30.0–36.0)
MCHC: 32.7 g/dL (ref 30.0–36.0)
MCV: 81.6 fL (ref 80.0–100.0)
MCV: 80.5 fL (ref 80.0–100.0)
Platelets: 166 K/uL (ref 150–400)
Platelets: 181 K/uL (ref 150–400)
RBC: 4.23 MIL/uL (ref 4.22–5.81)
RBC: 3.99 MIL/uL — ABNORMAL LOW (ref 4.22–5.81)
RDW: 16.1 % — ABNORMAL HIGH (ref 11.5–15.5)
RDW: 15.9 % — ABNORMAL HIGH (ref 11.5–15.5)
WBC: 4.8 K/uL (ref 4.0–10.5)
WBC: 6.1 K/uL (ref 4.0–10.5)
nRBC: 0 % (ref 0.0–0.2)
nRBC: 0 % (ref 0.0–0.2)

## 2024-04-13 LAB — ECHOCARDIOGRAM COMPLETE
AR max vel: 2.6 cm2
AV Area VTI: 2.63 cm2
AV Area mean vel: 2.71 cm2
AV Mean grad: 13 mmHg
AV Peak grad: 21 mmHg
Ao pk vel: 2.29 m/s
Area-P 1/2: 4.49 cm2
Calc EF: 22.3 %
Height: 72 in
P 1/2 time: 472 ms
S' Lateral: 5.85 cm
Single Plane A2C EF: 22.4 %
Single Plane A4C EF: 22 %
Weight: 2736 [oz_av]

## 2024-04-13 LAB — COMPREHENSIVE METABOLIC PANEL WITH GFR
ALT: 13 U/L (ref 0–44)
AST: 18 U/L (ref 15–41)
Albumin: 3.8 g/dL (ref 3.5–5.0)
Alkaline Phosphatase: 30 U/L — ABNORMAL LOW (ref 38–126)
Anion gap: 9 (ref 5–15)
BUN: 41 mg/dL — ABNORMAL HIGH (ref 8–23)
CO2: 20 mmol/L — ABNORMAL LOW (ref 22–32)
Calcium: 9.1 mg/dL (ref 8.9–10.3)
Chloride: 109 mmol/L (ref 98–111)
Creatinine, Ser: 2.05 mg/dL — ABNORMAL HIGH (ref 0.61–1.24)
GFR, Estimated: 33 mL/min — ABNORMAL LOW (ref >60)
Glucose, Bld: 93 mg/dL (ref 70–99)
Potassium: 5.1 mmol/L (ref 3.5–5.1)
Sodium: 138 mmol/L (ref 135–145)
Total Bilirubin: 1.4 mg/dL — ABNORMAL HIGH (ref 0.0–1.2)
Total Protein: 7.8 g/dL (ref 6.5–8.1)

## 2024-04-13 LAB — CREATININE, SERUM
Creatinine, Ser: 1.98 mg/dL — ABNORMAL HIGH (ref 0.61–1.24)
GFR, Estimated: 34 mL/min — ABNORMAL LOW (ref >60)

## 2024-04-13 LAB — TROPONIN I (HIGH SENSITIVITY)
Troponin I (High Sensitivity): 27 ng/L — ABNORMAL HIGH (ref <18)
Troponin I (High Sensitivity): 37 ng/L — ABNORMAL HIGH (ref <18)

## 2024-04-13 MED ORDER — ASPIRIN 81 MG PO CHEW
81.0000 mg | CHEWABLE_TABLET | ORAL | Status: AC
Start: 2024-04-14 — End: 2024-04-15
  Administered 2024-04-14: 81 mg via ORAL
  Filled 2024-04-13: qty 1

## 2024-04-13 MED ORDER — ACETAMINOPHEN 325 MG PO TABS: 650.0000 mg | ORAL_TABLET | ORAL | Status: DC | PRN

## 2024-04-13 MED ORDER — HYDRALAZINE HCL 50 MG PO TABS
100.0000 mg | ORAL_TABLET | Freq: Three times a day (TID) | ORAL | Status: DC
  Administered 2024-04-13 – 2024-04-15 (×6): 100 mg via ORAL
  Filled 2024-04-13 (×7): qty 2

## 2024-04-13 MED ORDER — FREE WATER
250.0000 mL | Freq: Once | Status: AC
  Administered 2024-04-14: 250 mL via ORAL

## 2024-04-13 MED ORDER — ONDANSETRON HCL 4 MG/2ML IJ SOLN: 4.0000 mg | Freq: Four times a day (QID) | INTRAMUSCULAR | Status: DC | PRN

## 2024-04-13 MED ORDER — ISOSORBIDE MONONITRATE ER 30 MG PO TB24
30.0000 mg | ORAL_TABLET | Freq: Every day | ORAL | Status: DC
  Administered 2024-04-13 – 2024-04-15 (×3): 30 mg via ORAL
  Filled 2024-04-13 (×3): qty 1

## 2024-04-13 MED ORDER — ENOXAPARIN SODIUM 40 MG/0.4ML IJ SOSY
40.0000 mg | PREFILLED_SYRINGE | INTRAMUSCULAR | Status: DC
  Administered 2024-04-13: 40 mg via SUBCUTANEOUS
  Filled 2024-04-13: qty 0.4

## 2024-04-13 MED ORDER — NITROGLYCERIN 0.4 MG SL SUBL: 0.4000 mg | SUBLINGUAL_TABLET | SUBLINGUAL | Status: DC | PRN

## 2024-04-13 MED ORDER — ASPIRIN 81 MG PO TBEC
81.0000 mg | DELAYED_RELEASE_TABLET | Freq: Every day | ORAL | Status: DC
  Administered 2024-04-14 – 2024-04-15 (×2): 81 mg via ORAL
  Filled 2024-04-13: qty 1

## 2024-04-13 MED ORDER — AMLODIPINE BESYLATE 10 MG PO TABS: 10.0000 mg | ORAL_TABLET | Freq: Every day | ORAL | Status: DC

## 2024-04-13 MED ORDER — HYDRALAZINE HCL 50 MG PO TABS
50.0000 mg | ORAL_TABLET | Freq: Three times a day (TID) | ORAL | Status: DC
Start: 2024-04-13 — End: 2024-04-13
  Administered 2024-04-13: 50 mg via ORAL
  Filled 2024-04-13: qty 1

## 2024-04-13 NOTE — H&P (Signed)
 See office note 10/9 from Dr. Lavona -   Patient sent from office for evaluation of chest pain. New LBBB on EKG. Concern for unstable angina. Also had a cardiac murmur on exam and needs echocardiogram. In the ED, patient denies chest pain. Has mild nausea which is improving. He tells me that he has been told that he has an abnormal EKG for a long time, but does not know specifics. He was referred to cardiology for cardiac murmur. Ordered STAT echo. Echo tech at bedside. Ordered BMP, CBC, troponin       Cardiology Office Note     Date:  04/13/2024    ID:  Nathan Castro, DOB 03-Dec-1945, MRN [id number]   PCP:  Nathan Lamarr BRAVO, NP    Cardiologist:   None Referring:  Nathan Lamarr BRAVO, NP   No chief complaint on file.       History of Present Illness: Nathan Castro is a 78 y.o. male who presents for evaluation of an abnormal EKG.  He reports that he has had no past cardiac history but he really does not go to the doctor very frequently.  He said that for months he has been having mid chest discomfort.  It comes and goes spontaneously.  It happens at rest.  It is 5-7 out of 10 in intensity and lasts for minutes at a time.  It is substernal and radiates slightly to the left.  He had it today in our waiting area.  He actually felt presyncopal.  He is not having any acute nausea vomiting or diaphoresis with this.  He does not have radiation to his left arm or his jaw.  He not had this before.  He does not describe PND or orthopnea.  He has had decreased exercise tolerance over about a year and a half.  He is felt more fatigued.  He has felt more short of breath during activity such as climbing the stairs.  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  He does have renal insufficiency that is followed.  He was seen at [facility].  He is found to have a left bundle branch block and is referred here.  I did review their most recent notes.  He is followed for  dyslipidemia and hypertension.  He is have prostate cancer he said he is cancer free.  He does report a chest x-ray though I do not see this result.         Past Medical History:  Diagnosis Date   Anemia     Arthritis      rheumatoid arthritis   Arthropathy     Barrett esophagus     Bilirubinuria     CKD (chronic kidney disease), stage III (HCC)     Elevated blood pressure reading     Elevated PSA     Gastric polyps     H. pylori infection     Hypertension     Hypertriglyceridemia     Iron deficiency     Osteopenia     Prostate cancer (HCC)     Proteinuria     RBC abnormality     Sialadenitis     Submandibular lymphadenitis     Vertigo                 Past Surgical History:  Procedure Laterality Date   INGUINAL HERNIA REPAIR   2000   PROSTATE BIOPSY  Current Outpatient Medications  Medication Sig Dispense Refill   amLODipine -benazepril  (LOTREL) 10-40 MG capsule Take 1 capsule by mouth daily. 30 capsule 5   Cholecalciferol (VITAMIN D3) 1.25 MG (50000 UT) CAPS Take by mouth.       ferrous sulfate 325 (65 FE) MG tablet Take 325 mg by mouth daily with breakfast.       Multiple Vitamin (MULTIVITAMIN) capsule Take 1 capsule by mouth daily.       Omega-3 Fatty Acids (FISH OIL) 1000 MG CAPS Take by mouth.       Acetaminophen  (TYLENOL  8 HOUR PO) Take by mouth. (Patient not taking: Reported on 04/13/2024)       Ascorbic Acid (VITAMIN C PO) Take by mouth. (Patient not taking: Reported on 04/13/2024)       Garlic 1000 MG CAPS Take by mouth. (Patient not taking: Reported on 04/13/2024)       gatifloxacin (ZYMAXID) 0.5 % SOLN  (Patient not taking: Reported on 04/13/2024)       ibuprofen (ADVIL) 100 MG tablet Take 100 mg by mouth every 6 (six) hours as needed for fever. (Patient not taking: Reported on 04/13/2024)       methylPREDNISolone  (MEDROL  DOSEPAK) 4 MG TBPK tablet Take post-op to decrease swelling (Patient not taking: Reported on 04/13/2024) 1 each 1    Multiple Vitamins-Minerals (ZINC PO) Take by mouth. (Patient not taking: Reported on 04/13/2024)       Omega-3 Fatty Acids (FISH OIL PO) Take by mouth. (Patient not taking: Reported on 04/13/2024)       oxyCODONE  (ROXICODONE ) 5 MG immediate release tablet Take 1 tablet (5 mg total) by mouth every 6 (six) hours as needed for severe pain (pain score 7-10). (Patient not taking: Reported on 04/13/2024) 20 tablet 0   phenazopyridine  (PYRIDIUM ) 100 MG tablet Take 1 tablet (100 mg total) by mouth 3 (three) times daily as needed for pain. (Patient not taking: Reported on 04/13/2024) 25 tablet 0   sildenafil (VIAGRA) 100 MG tablet Take 100 mg by mouth daily as needed for erectile dysfunction. (Patient not taking: Reported on 04/13/2024)       tamsulosin  (FLOMAX ) 0.4 MG CAPS capsule Take 1 capsule (0.4 mg total) by mouth at bedtime. (Patient not taking: Reported on 04/13/2024) 30 capsule 0      No current facility-administered medications for this visit.        Allergies:   Percocet [oxycodone -acetaminophen ]      Social History:  The patient  reports that he has never smoked. He has never used smokeless tobacco. He reports that he does not drink alcohol and does not use drugs.    Family History:  The patient's family history includes Breast cancer in his sister; Colon cancer in his brother.      ROS:  Please see the history of present illness.   Otherwise, review of systems are positive for none.   All other systems are reviewed and negative.      PHYSICAL EXAM: VS:  BP (!) 180/70   Pulse 71   Ht 6' (1.829 m)   Wt 171 lb (77.6 kg)   SpO2 99%   BMI 23.19 kg/m  , BMI Body mass index is 23.19 kg/m. GENERAL:  Well appearing HEENT:  Pupils equal round and reactive, fundi not visualized, oral mucosa unremarkable NECK:  No jugular venous distention, waveform within normal limits, carotid upstroke brisk and symmetric, no bruits, no thyromegaly LYMPHATICS:  No cervical, inguinal adenopathy LUNGS:  Clear  to auscultation  bilaterally BACK:  No CVA tenderness CHEST:  Unremarkable HEART:  PMI not displaced or sustained,S1 and S2 within normal limits, no S3, no S4, no clicks, no rubs, 3 out of 6 systolic murmur radiating out the aortic outflow tract and into the carotids, 2 out of 6 diastolic murmur heard at the third left intercostal space murmurs ABD:  Flat, positive bowel sounds normal in frequency in pitch, no bruits, no rebound, no guarding, no midline pulsatile mass, no hepatomegaly, no splenomegaly EXT:  2 plus pulses throughout, no edema, no cyanosis no clubbing SKIN:  No rashes no nodules NEURO:  Cranial nerves II through XII grossly intact, motor grossly intact throughout [facility]:  Cognitively intact, oriented to person place and time       EKG:  EKG Interpretation Date/Time:                  Thursday April 13 2024 11:31:12 EDT Ventricular Rate:         71 PR Interval:                 196 QRS Duration:             186 QT Interval:                 464 QTC Calculation:504 R Axis:                         -60   Text Interpretation:Normal sinus rhythm Possible Left atrial enlargement Left axis deviation Left bundle branch block No previous ECGs available Confirmed by Lavona Agent ([id number]) on 04/13/2024 11:39:06 AM        Recent Labs: No results found for requested labs within last 365 days.      Lipid Panel Labs (Brief)  No results found for: CHOL, TRIG, HDL, CHOLHDL, VLDL, LDLCALC, LDLDIRECT          Wt Readings from Last 3 Encounters:  04/13/24 171 lb (77.6 kg)  03/19/23 179 lb 3.2 oz (81.3 kg)  03/05/23 168 lb (76.2 kg)        Other studies Reviewed: Additional studies/ records that were reviewed today include: Primary care office records. Review of the above records demonstrates:  Please see elsewhere in the note.       ASSESSMENT AND PLAN:     Abnormal EKG: The patient has a left bundle branch block with no EKGs for comparison.  This will be  managed as below.   Chest pain: He is having resting chest discomfort but certainly could be unstable angina.  He needs to be admitted to the hospital through the emergency room and we will call EMS.  My team has been notified for further workup which will include cycling enzymes and consideration of cardiac cath electively pending his blood work.     CKD: He has a history of CKD.  I do not know the etiology other than hypertension.  This will need to be followed closely avoiding nephrotoxic agents.   HTN: His blood pressure is not at target and will be treated in the context of managing his chest pain.   Dyslipidemia: No need risk reduction lipid profile and therapy based on these results.   Murmur: He has aortic stenosis and aortic insufficiency by exam.  Needs echocardiography to quantify.       Current medicines are reviewed at length with the patient today.  The patient does not have concerns regarding medicines.   The  following changes have been made:  no change   Labs/ tests ordered today include:       Orders Placed This Encounter  Procedures   EKG 12-Lead   EKG 12-Lead        Disposition:   FU with with me after the hospitalization.       Signed, Lynwood Schilling, MD  04/13/2024 11:57 AM    [facility]

## 2024-04-13 NOTE — Plan of Care (Signed)
  Problem: Education: Goal: Understanding of cardiac disease, CV risk reduction, and recovery process will improve Outcome: Progressing Goal: Individualized Educational Video(s) Outcome: Progressing   Problem: Activity: Goal: Ability to tolerate increased activity will improve Outcome: Progressing   

## 2024-04-13 NOTE — Progress Notes (Signed)
   Baseline creatinine appears to be 2.2 per lab work from [employer]. Discussed risks and benefits of R/L heart catheterization. Patient and family willing to proceed, but would like to discuss with MD in the AM. Arranged cath for tomorrow afternoon.   Informed Consent   Shared Decision Making/Informed Consent The risks [stroke (1 in 1000), death (1 in 1000), kidney failure [usually temporary] (1 in 500), bleeding (1 in 200), allergic reaction [possibly serious] (1 in 200)], benefits (diagnostic support and management of coronary artery disease) and alternatives of a cardiac catheterization were discussed in detail with Mr. Lippy and he is willing to proceed.     Rollo FABIENE Louder, PA-C 04/13/2024 5:48 PM

## 2024-04-13 NOTE — ED Triage Notes (Signed)
 Per [facility] pt coming from cardiology appt- had a near syncopal episode and having chest pain. Went to cardiology for ongoing weakness, shortness of breath, fatigue, intermittent chest pain and abnormal EKG. Having nausea on arrival. EKG showing left bundle branch block. Given 324 aspirin PTA.

## 2024-04-13 NOTE — ED Provider Triage Note (Signed)
 Emergency Medicine Provider Triage Evaluation Note  Nathan Castro , a 78 y.o. male  was evaluated in triage.  Pt complains of chest pain.  Seen by Dr. Lavona with cardiology today and sent in for further evaluation with cardiac enzymes and potential plan for cardiac cath.  Patient reports chest pain has been occurring intermittently for months but getting worse with some intermittent shortness of breath.  Chest pain seems to come and go without obvious trigger..  Review of Systems  Positive: Chest pain, shortness of breath Negative: Fever, cough, lower extremity edema  Physical Exam  BP (!) 165/68   Pulse 61   Temp 98.5 F (36.9 C) (Oral)   Resp 18   Ht 6' (1.829 m)   Wt 77.6 kg   SpO2 100%   BMI 23.19 kg/m  Gen:   Awake, no distress   Resp:  Normal effort  MSK:   Moves extremities without difficulty  Other:    Medical Decision Making  Medically screening exam initiated at 1:30 PM.  Appropriate orders placed.  Kailo Kosik was informed that the remainder of the evaluation will be completed by another provider, this initial triage assessment does not replace that evaluation, and the importance of remaining in the ED until their evaluation is complete.     Alva Larraine FALCON, PA-C 04/13/24 1332

## 2024-04-13 NOTE — Progress Notes (Signed)
 The patient has has mildly elevated troponins 27 > 37. I contacted the patient and denied any chest pain since he has been in the hospital. The patient is scheduled for a cardiac cath tomorrow.  Will continue current management.   Signed,  Morse Clause, PA-C 04/13/2024, 7:47 PM

## 2024-04-13 NOTE — Progress Notes (Addendum)
  Echocardiogram 2D Echocardiogram has been performed. Results communicated to cardiology  Devora Ellouise SAUNDERS 04/13/2024, 2:21 PM

## 2024-04-13 NOTE — Progress Notes (Signed)
   Echocardiogram showed EF 20-25% with global hypokinesis, grade I DD, mildly reduced RV systolic function, mild-moderate aortic valve regurgitation. Mild aortic valve stenosis. Labwork, CXR pending.   Per chart review, creatinine was 1.9 in 03/2023. BP elevated, 165/68. Start imdur 30 mg daily and hydralazine 50 mg TID. Can further titrate GDMT as renal function allows once labs are resulted. Could consider R/L heart cath tomorrow pending renal function. Dr. Barbaraann to see   Nathan FABIENE Louder, PA-C 04/13/2024 2:45 PM

## 2024-04-14 ENCOUNTER — Encounter (HOSPITAL_COMMUNITY)
Admission: EM | Disposition: A | Discharge status: Home / Self Care | Payer: Self-pay | Source: Ambulatory Visit | Attending: Cardiovascular Disease

## 2024-04-14 DIAGNOSIS — I10 Essential (primary) hypertension: Secondary | ICD-10-CM

## 2024-04-14 DIAGNOSIS — I2510 Atherosclerotic heart disease of native coronary artery without angina pectoris: Secondary | ICD-10-CM | POA: Diagnosis not present

## 2024-04-14 DIAGNOSIS — I351 Nonrheumatic aortic (valve) insufficiency: Secondary | ICD-10-CM | POA: Diagnosis not present

## 2024-04-14 DIAGNOSIS — N1832 Chronic kidney disease, stage 3b: Secondary | ICD-10-CM | POA: Diagnosis not present

## 2024-04-14 DIAGNOSIS — I447 Left bundle-branch block, unspecified: Secondary | ICD-10-CM | POA: Diagnosis not present

## 2024-04-14 HISTORY — PX: RIGHT/LEFT HEART CATH AND CORONARY ANGIOGRAPHY: CATH118266

## 2024-04-14 LAB — LIPID PANEL
Cholesterol: 171 mg/dL (ref 0–200)
HDL: 33 mg/dL — ABNORMAL LOW (ref >40)
LDL Cholesterol: 114 mg/dL — ABNORMAL HIGH (ref 0–99)
Total CHOL/HDL Ratio: 5.2 ratio
Triglycerides: 118 mg/dL (ref <150)
VLDL: 24 mg/dL (ref 0–40)

## 2024-04-14 LAB — BASIC METABOLIC PANEL WITH GFR
Anion gap: 8 (ref 5–15)
BUN: 39 mg/dL — ABNORMAL HIGH (ref 8–23)
CO2: 21 mmol/L — ABNORMAL LOW (ref 22–32)
Calcium: 8.7 mg/dL — ABNORMAL LOW (ref 8.9–10.3)
Chloride: 108 mmol/L (ref 98–111)
Creatinine, Ser: 2 mg/dL — ABNORMAL HIGH (ref 0.61–1.24)
GFR, Estimated: 34 mL/min — ABNORMAL LOW (ref >60)
Glucose, Bld: 82 mg/dL (ref 70–99)
Potassium: 3.9 mmol/L (ref 3.5–5.1)
Sodium: 137 mmol/L (ref 135–145)

## 2024-04-14 LAB — CBC
HCT: 29.4 % — ABNORMAL LOW (ref 39.0–52.0)
Hemoglobin: 9.8 g/dL — ABNORMAL LOW (ref 13.0–17.0)
MCH: 26.6 pg (ref 26.0–34.0)
MCHC: 33.3 g/dL (ref 30.0–36.0)
MCV: 79.7 fL — ABNORMAL LOW (ref 80.0–100.0)
Platelets: 155 K/uL (ref 150–400)
RBC: 3.69 MIL/uL — ABNORMAL LOW (ref 4.22–5.81)
RDW: 16 % — ABNORMAL HIGH (ref 11.5–15.5)
WBC: 5.3 K/uL (ref 4.0–10.5)
nRBC: 0 % (ref 0.0–0.2)

## 2024-04-14 LAB — TSH: TSH: 3.87 u[IU]/mL (ref 0.350–4.500)

## 2024-04-14 LAB — HEMOGLOBIN A1C
Hgb A1c MFr Bld: 4.8 % (ref 4.8–5.6)
Mean Plasma Glucose: 91.06 mg/dL

## 2024-04-14 SURGERY — RIGHT/LEFT HEART CATH AND CORONARY ANGIOGRAPHY
Anesthesia: LOCAL

## 2024-04-14 MED ORDER — SODIUM CHLORIDE 0.9 % IV SOLN: INTRAVENOUS | Status: DC

## 2024-04-14 MED ORDER — HEPARIN SODIUM (PORCINE) 1000 UNIT/ML IJ SOLN
INTRAMUSCULAR | Status: AC
  Filled 2024-04-14: qty 10

## 2024-04-14 MED ORDER — VERAPAMIL HCL 2.5 MG/ML IV SOLN
INTRAVENOUS | Status: DC | PRN
  Administered 2024-04-14: 10 mL via INTRA_ARTERIAL

## 2024-04-14 MED ORDER — LIDOCAINE HCL (PF) 1 % IJ SOLN
INTRAMUSCULAR | Status: DC | PRN
  Administered 2024-04-14: 8 mL

## 2024-04-14 MED ORDER — HEPARIN (PORCINE) IN NACL 1000-0.9 UT/500ML-% IV SOLN
INTRAVENOUS | Status: DC | PRN
  Administered 2024-04-14: 1000 mL via SURGICAL_CAVITY

## 2024-04-14 MED ORDER — EZETIMIBE 10 MG PO TABS
10.0000 mg | ORAL_TABLET | Freq: Every day | ORAL | Status: DC
  Administered 2024-04-14 – 2024-04-15 (×2): 10 mg via ORAL
  Filled 2024-04-14 (×2): qty 1

## 2024-04-14 MED ORDER — LIDOCAINE HCL (PF) 1 % IJ SOLN
INTRAMUSCULAR | Status: AC
  Filled 2024-04-14: qty 30

## 2024-04-14 MED ORDER — FENTANYL CITRATE (PF) 100 MCG/2ML IJ SOLN
INTRAMUSCULAR | Status: AC
  Filled 2024-04-14: qty 2

## 2024-04-14 MED ORDER — SODIUM CHLORIDE 0.9 % IV BOLUS: 250.0000 mL | Freq: Once | INTRAVENOUS | Status: DC

## 2024-04-14 MED ORDER — HEPARIN SODIUM (PORCINE) 5000 UNIT/ML IJ SOLN
5000.0000 [IU] | Freq: Three times a day (TID) | INTRAMUSCULAR | Status: DC
  Administered 2024-04-14 (×2): 5000 [IU] via SUBCUTANEOUS
  Filled 2024-04-14 (×4): qty 1

## 2024-04-14 MED ORDER — IOHEXOL 350 MG/ML SOLN
INTRAVENOUS | Status: DC | PRN
  Administered 2024-04-14: 65 mL

## 2024-04-14 MED ORDER — MIDAZOLAM HCL 2 MG/2ML IJ SOLN
INTRAMUSCULAR | Status: AC
Start: 2024-04-14 — End: 2024-04-14
  Filled 2024-04-14: qty 2

## 2024-04-14 MED ORDER — HEPARIN SODIUM (PORCINE) 1000 UNIT/ML IJ SOLN
INTRAMUSCULAR | Status: DC | PRN
  Administered 2024-04-14: 5000 [IU] via INTRAVENOUS

## 2024-04-14 MED ORDER — FENTANYL CITRATE (PF) 100 MCG/2ML IJ SOLN
INTRAMUSCULAR | Status: DC | PRN
  Administered 2024-04-14 (×2): 25 ug via INTRAVENOUS

## 2024-04-14 MED ORDER — SODIUM CHLORIDE 0.9 % IV BOLUS
250.0000 mL | Freq: Once | INTRAVENOUS | Status: AC
  Administered 2024-04-14: 250 mL via INTRAVENOUS

## 2024-04-14 MED ORDER — MIDAZOLAM HCL 2 MG/2ML IJ SOLN
INTRAMUSCULAR | Status: DC | PRN
  Administered 2024-04-14 (×2): 1 mg via INTRAVENOUS

## 2024-04-14 MED ORDER — VERAPAMIL HCL 2.5 MG/ML IV SOLN
INTRAVENOUS | Status: AC
  Filled 2024-04-14: qty 2

## 2024-04-14 MED ORDER — ATORVASTATIN CALCIUM 40 MG PO TABS: 40.0000 mg | ORAL_TABLET | Freq: Every day | ORAL | Status: DC

## 2024-04-14 SURGICAL SUPPLY — 15 items
CATH 5FR JL3.5 JR4 ANG PIG MP (CATHETERS) IMPLANT
CATH BALLN WEDGE 5F 110CM (CATHETERS) IMPLANT
CATH EXPO 5F MPA-1 (CATHETERS) IMPLANT
CATH INFINITI 5 FR 3DRC (CATHETERS) IMPLANT
CATH INFINITI 5 FR AR1 MOD (CATHETERS) IMPLANT
CATH INFINITI 5FR JL4 (CATHETERS) IMPLANT
CATH INFINITI AMBI 5FR JK (CATHETERS) IMPLANT
CATH LAUNCHER 6FR AL1 (CATHETERS) IMPLANT
DEVICE RAD TR BAND REGULAR (VASCULAR PRODUCTS) IMPLANT
GLIDESHEATH SLEND SS 6F .021 (SHEATH) IMPLANT
GUIDEWIRE INQWIRE 1.5J.035X260 (WIRE) IMPLANT
KIT SYRINGE INJ CVI SPIKEX1 (MISCELLANEOUS) IMPLANT
PACK CARDIAC CATHETERIZATION (CUSTOM PROCEDURE TRAY) ×1 IMPLANT
SET ATX-X65L (MISCELLANEOUS) IMPLANT
SHEATH GLIDE SLENDER 4/5FR (SHEATH) IMPLANT

## 2024-04-14 NOTE — Progress Notes (Signed)
 Rounding Note   Patient Name: Nathan Castro Date of Encounter: 04/14/2024  [facility] Cardiologist: Lynwood Schilling, MD   Subjective No acute overnight events.  He reports some mild chest pain and right arm pain last night but none this morning.  No shortness of breath.  His only real complaint this morning is that he feels warm. He is wondering if this is due to his medications.  He is afebrile.  Scheduled Meds:  aspirin EC  81 mg Oral Daily   enoxaparin (LOVENOX) injection  40 mg Subcutaneous Q24H   hydrALAZINE  100 mg Oral Q8H   isosorbide mononitrate  30 mg Oral Daily   Continuous Infusions:  sodium chloride      PRN Meds: acetaminophen , nitroGLYCERIN, ondansetron  (ZOFRAN ) IV   Vital Signs  Vitals:   04/14/24 0158 04/14/24 0159 04/14/24 0400 04/14/24 0733  BP:    (!) 141/56  Pulse: (!) 55 (!) 53  (!) 58  Resp:   18 18  Temp:   98.1 F (36.7 C) 97.7 F (36.5 C)  TempSrc:   Oral Oral  SpO2: 100% 100%  100%  Weight:      Height:        Intake/Output Summary (Last 24 hours) at 04/14/2024 0922 Last data filed at 04/14/2024 0603 Gross per 24 hour  Intake 250 ml  Output --  Net 250 ml      04/13/2024    6:25 PM 04/13/2024    1:03 PM 04/13/2024   11:39 AM  Last 3 Weights  Weight (lbs) 169 lb 12.1 oz 171 lb 171 lb  Weight (kg) 77 kg 77.565 kg 77.565 kg      Telemetry Normal sinus rhythm with rates in the 50s to 60s.- Personally Reviewed  ECG  No new ECG tracing today.- Personally Reviewed  Physical Exam  GEN: No acute distress.   Neck: No JVD. Cardiac: RRR. III/VI systolic murmur.  Respiratory: Clear to auscultation bilaterally. No wheezes, rhonchi, or rales. GI: Soft, non-distended, and non-tender. MS: No lower extremity edema. No deformity. Neuro:  No focal deficits. Psych: Normal affect. Responds appropriately.  Labs High Sensitivity Troponin:   Recent Labs  Lab 04/13/24 1432 04/13/24 1826  TROPONINIHS 27* 37*      Chemistry Recent Labs  Lab 04/13/24 1432 04/13/24 1826 04/14/24 0232  NA 138  --  137  K 5.1  --  3.9  CL 109  --  108  CO2 20*  --  21*  GLUCOSE 93  --  82  BUN 41*  --  39*  CREATININE 2.05* 1.98* 2.00*  CALCIUM 9.1  --  8.7*  PROT 7.8  --   --   ALBUMIN 3.8  --   --   AST 18  --   --   ALT 13  --   --   ALKPHOS 30*  --   --   BILITOT 1.4*  --   --   GFRNONAA 33* 34* 34*  ANIONGAP 9  --  8    Lipids  Recent Labs  Lab 04/14/24 0232  CHOL 171  TRIG 118  HDL 33*  LDLCALC 114*  CHOLHDL 5.2    Hematology Recent Labs  Lab 04/13/24 1432 04/13/24 1826 04/14/24 0232  WBC 4.8 6.1 5.3  RBC 4.23 3.99* 3.69*  HGB 11.0* 10.5* 9.8*  HCT 34.5* 32.1* 29.4*  MCV 81.6 80.5 79.7*  MCH 26.0 26.3 26.6  MCHC 31.9 32.7 33.3  RDW 16.1* 15.9* 16.0*  PLT 166  181 155   Thyroid  Recent Labs  Lab 04/14/24 0232  TSH 3.870    BNPNo results for input(s): BNP, PROBNP in the last 168 hours.  DDimer No results for input(s): DDIMER in the last 168 hours.   Cardiac Studies  Echocardiogram 04/13/2024: Impressions: 1. Left ventricular ejection fraction, by estimation, is 20 to 25%. Left  ventricular ejection fraction by 2D MOD biplane is 22.3 %. The left  ventricle has severely decreased function. The left ventricle demonstrates  global hypokinesis. The left  ventricular internal cavity size was moderately dilated. There is mild  concentric left ventricular hypertrophy. Left ventricular diastolic  parameters are consistent with Grade I diastolic dysfunction (impaired  relaxation).   2. Right ventricular systolic function is mildly reduced. The right  ventricular size is normal. Tricuspid regurgitation signal is inadequate  for assessing PA pressure.   3. The mitral valve is normal in structure. No evidence of mitral valve  regurgitation. No evidence of mitral stenosis.   4. The aortic valve has an indeterminant number of cusps. There is  moderate calcification of the  aortic valve. Aortic valve regurgitation is  mild to moderate. Mild aortic valve stenosis. Aortic valve mean gradient  measures 13.0 mmHg.   5. Aortic dilatation noted. There is mild dilatation of the aortic root,  measuring 40 mm. There is borderline dilatation of the ascending aorta,  measuring 38 mm.   6. The inferior vena cava is normal in size with greater than 50%  respiratory variability, suggesting right atrial pressure of 3 mmHg.    Patient Profile   78 y.o. male with a history of LBBB, hypertension, dyslipidemia, and CKD stage III who seen in the office as a new patient on 04/13/2024 for further evaluation of an abnormal EKG which showed a LBBB and reported resting chest pain in clinic concerning for unstable angina.  Therefore, he was directly admitted from the office.  Assessment & Plan   Chest Pain LBBB Elevated Troponin Patient was admitted for resting chest pain in the office concerning for unstable angina.  EKG showed normal sinus rhythm with LBBB.  High-sensitivity troponin minimally elevated and flat at 27 >> 37.  Echo showed LVEF of 20-25% with global hypokinesis. - No current chest pain. - Continue Aspirin 81 mg daily. - Will start Lipitor 40mg  daily. - Troponin elevation not consistent with ACS.  However, plan is for right/left cardiac catheterization today for further evaluation given newly reduced EF.  He was consented for the procedure yesterday and denies having any questions about it today.  Will give a 250cc bolus of NaCL prior to cath given his renal function.  New Cardiomyopathy - Etiology Unclear Echo this admission showed EF of 20-25% with a moderately dilated LV and global hypokinesis, mild concentric LVH, and grade 1 diastolic dysfunction as well as mildly reduced RV function, mild aortic stenosis/mild to moderate aortic regurgitation, and mild dilatation of the aortic root measuring 40 mm. - Euvolemic on exam.  No signs or symptoms of CHF. - No need for  diuretics at this time. - GDMT today by renal function.  No ACEi/ ARB/ Entresto or MRA for this reason. - No beta-blocker given low resting heart rates. - Continue Imdur 30mg  daily and Hydralazine 100mg  three time daily.  - Will hold off on SGLT 2 inhibitor for now.  Can consider adding this later. - Etiology unclear.  Will proceed with a right/left cardiac catheterization today.  Mild Aortic Stenosis  Mild to Moderate Aortic Insufficiency  Noted on Echo this admission. - Can continue to follow this as an outpatient.  Depending on what cardiac catheterization shows, may benefit from TEE.  Hypertension BP was initially 180/70 in the office on 04/13/2024.  Much improved with current medications. - Home Amlodipine -Benazepril  was stopped and he was started on Imdur and Hydralazine as above. - Will treat in context of CHF as above.  Hyperlipidemia Lipid panel this admission: Total Cholesterol 171, Triglycerides 118, HDL 33, LDL 114.  - Will start Lipitor 40mg  daily. - Will need repeat lipid panel and LFTs in 6-8 weeks.  CKD Stage IIIb Baseline creatinine around 2.2 per records from [facility]. - Stable at 2.00 today. - Continue to monitor closely.   For questions or updates, please contact [facility] Please consult [email or web address] for contact info under       Signed, Jolicia Delira E Syre Knerr, PA-C  04/14/2024, 9:22 AM

## 2024-04-14 NOTE — Interval H&P Note (Signed)
 History and Physical Interval Note:  04/14/2024 12:39 PM  Nathan Castro  has presented today for surgery, with the diagnosis of Systolic Heart Failure.  The various methods of treatment have been discussed with the patient and family. After consideration of risks, benefits and other options for treatment, the patient has consented to  Procedure(s): RIGHT/LEFT HEART CATH AND CORONARY ANGIOGRAPHY (N/A) as a surgical intervention.  The patient's history has been reviewed, patient examined, no change in status, stable for surgery.  I have reviewed the patient's chart and labs.  Questions were answered to the patient's satisfaction.     [name]

## 2024-04-14 NOTE — TOC CM/SW Note (Signed)
 Transition of Care [facility]) - Inpatient Brief Assessment   Patient Details  Name: Nathan Castro MRN: [id number] Date of Birth: 1946-05-30  [facility] of [facility]) CM/SW Contact:    Waddell Barnie Rama, RN Phone Number: 04/14/2024, 2:45 PM   Clinical Narrative: From home with spouse, has PCP and insurance on file, states has no HH services in place at this time or DME at home.  States family member will transport them home at [employer] and family is support system, states gets medications from [facility] on [address] in [place]  Pta self ambulatory.   There are no ICM needs identified  at this time.  Please place consult for ICM  needs.  For R/L cath today.   Transition of Care Asessment: Insurance and Status: Insurance coverage has been reviewed Patient has primary care physician: Yes Home environment has been reviewed: home with family Prior level of function:: indep Prior/Current Home Services: No current home services Social Drivers of Health Review: SDOH reviewed no interventions necessary Readmission risk has been reviewed: Yes Transition of care needs: no transition of care needs at this time

## 2024-04-15 ENCOUNTER — Telehealth: Payer: Self-pay | Admitting: Physician Assistant

## 2024-04-15 DIAGNOSIS — I5042 Chronic combined systolic (congestive) and diastolic (congestive) heart failure: Secondary | ICD-10-CM

## 2024-04-15 LAB — BASIC METABOLIC PANEL WITH GFR
Anion gap: 8 (ref 5–15)
BUN: 34 mg/dL — ABNORMAL HIGH (ref 8–23)
CO2: 20 mmol/L — ABNORMAL LOW (ref 22–32)
Calcium: 9 mg/dL (ref 8.9–10.3)
Chloride: 107 mmol/L (ref 98–111)
Creatinine, Ser: 2.03 mg/dL — ABNORMAL HIGH (ref 0.61–1.24)
GFR, Estimated: 33 mL/min — ABNORMAL LOW (ref >60)
Glucose, Bld: 84 mg/dL (ref 70–99)
Potassium: 5.2 mmol/L — ABNORMAL HIGH (ref 3.5–5.1)
Sodium: 135 mmol/L (ref 135–145)

## 2024-04-15 LAB — CBC
HCT: 31.9 % — ABNORMAL LOW (ref 39.0–52.0)
Hemoglobin: 10.3 g/dL — ABNORMAL LOW (ref 13.0–17.0)
MCH: 25.5 pg — ABNORMAL LOW (ref 26.0–34.0)
MCHC: 32.3 g/dL (ref 30.0–36.0)
MCV: 79 fL — ABNORMAL LOW (ref 80.0–100.0)
Platelets: 166 K/uL (ref 150–400)
RBC: 4.04 MIL/uL — ABNORMAL LOW (ref 4.22–5.81)
RDW: 16.8 % — ABNORMAL HIGH (ref 11.5–15.5)
WBC: 6.3 K/uL (ref 4.0–10.5)
nRBC: 0 % (ref 0.0–0.2)

## 2024-04-15 MED ORDER — CARVEDILOL 3.125 MG PO TABS
3.1250 mg | ORAL_TABLET | Freq: Two times a day (BID) | ORAL | Status: DC
Start: 2024-04-15 — End: 2024-04-15

## 2024-04-15 MED ORDER — EZETIMIBE 10 MG PO TABS: 10.0000 mg | ORAL_TABLET | Freq: Every day | ORAL | 11 refills | Status: AC

## 2024-04-15 MED ORDER — CARVEDILOL 3.125 MG PO TABS: 3.1250 mg | ORAL_TABLET | Freq: Two times a day (BID) | ORAL | 11 refills | Status: DC

## 2024-04-15 MED ORDER — HYDRALAZINE HCL 100 MG PO TABS: 100.0000 mg | ORAL_TABLET | Freq: Three times a day (TID) | ORAL | 11 refills | Status: AC

## 2024-04-15 MED ORDER — ASPIRIN 81 MG PO TBEC: 81.0000 mg | DELAYED_RELEASE_TABLET | Freq: Every day | ORAL | Status: AC

## 2024-04-15 MED ORDER — ISOSORBIDE MONONITRATE ER 30 MG PO TB24: 30.0000 mg | ORAL_TABLET | Freq: Every day | ORAL | 11 refills | Status: DC

## 2024-04-15 NOTE — Telephone Encounter (Signed)
   Transition of Care Follow-up Phone Call Request    Patient Name: Nathan Castro Date of Birth: 15-Oct-1945 Date of Encounter: 04/15/2024  Primary Care Provider:  Felisa Reece SQUIBB, MD Primary Cardiologist:  Lynwood Schilling, MD  Nathan Castro has been scheduled for a transition of care follow up appointment with a [facility] provider:  Scot Ford  Please reach out to Nathan Castro within 48 hours of discharge to confirm appointment and review transition of care protocol questionnaire. Anticipated discharge date: 04/15/2024  Scot Ford, [place]  04/15/2024, 2:27 PM

## 2024-04-15 NOTE — Progress Notes (Signed)
 Progress Note  Patient Name: Nathan Castro Date of Encounter: 04/15/2024  Primary Cardiologist:   Lynwood Schilling, MD   Subjective   He feels OK.  No chest pain.  No SOB.   Inpatient Medications    Scheduled Meds:  aspirin EC  81 mg Oral Daily   ezetimibe  10 mg Oral Daily   heparin injection (subcutaneous)  5,000 Units Subcutaneous Q8H   hydrALAZINE  100 mg Oral Q8H   isosorbide mononitrate  30 mg Oral Daily   Continuous Infusions:  PRN Meds: acetaminophen , nitroGLYCERIN, ondansetron  (ZOFRAN ) IV   Vital Signs    Vitals:   04/14/24 2140 04/15/24 0100 04/15/24 0548 04/15/24 0730  BP: (!) 160/74 (!) 159/69 131/61 (!) 154/81  Pulse: 67   92  Resp: 16 15 15 16   Temp: 98.3 F (36.8 C) 98.6 F (37 C) 97.8 F (36.6 C) 97.8 F (36.6 C)  TempSrc: Oral Oral Oral Oral  SpO2: 100%   99%  Weight:      Height:        Intake/Output Summary (Last 24 hours) at 04/15/2024 0807 Last data filed at 04/14/2024 1800 Gross per 24 hour  Intake 360 ml  Output --  Net 360 ml   Filed Weights   04/13/24 1303 04/13/24 1825  Weight: 77.6 kg 77 kg    Telemetry    RRR - Personally Reviewed  ECG    Clear - Personally Reviewed  Physical Exam   GEN: No acute distress.   Neck: No  JVD Cardiac: RRR,  2/6 apical systolic murmur, positive diastolic murmurs, rubs, or gallops.  Respiratory: Clear  to auscultation bilaterally. GI: Soft, nontender, non-distended  MS: No  edema; No deformity. Neuro:  Nonfocal  Psych: Normal affect   Labs    Chemistry Recent Labs  Lab 04/13/24 1432 04/13/24 1826 04/14/24 0232  NA 138  --  137  K 5.1  --  3.9  CL 109  --  108  CO2 20*  --  21*  GLUCOSE 93  --  82  BUN 41*  --  39*  CREATININE 2.05* 1.98* 2.00*  CALCIUM 9.1  --  8.7*  PROT 7.8  --   --   ALBUMIN 3.8  --   --   AST 18  --   --   ALT 13  --   --   ALKPHOS 30*  --   --   BILITOT 1.4*  --   --   GFRNONAA 33* 34* 34*  ANIONGAP 9  --  8     Hematology Recent Labs   Lab 04/13/24 1432 04/13/24 1826 04/14/24 0232  WBC 4.8 6.1 5.3  RBC 4.23 3.99* 3.69*  HGB 11.0* 10.5* 9.8*  HCT 34.5* 32.1* 29.4*  MCV 81.6 80.5 79.7*  MCH 26.0 26.3 26.6  MCHC 31.9 32.7 33.3  RDW 16.1* 15.9* 16.0*  PLT 166 181 155    Cardiac EnzymesNo results for input(s): TROPONINI in the last 168 hours. No results for input(s): TROPIPOC in the last 168 hours.   BNPNo results for input(s): BNP, PROBNP in the last 168 hours.   DDimer No results for input(s): DDIMER in the last 168 hours.   Radiology    DG Chest 2 View Result Date: 04/13/2024 CLINICAL DATA:  Chest pain. EXAM: CHEST - 2 VIEW COMPARISON:  None Available. FINDINGS: The heart size and mediastinal contours are within normal limits. Right lung is clear. Minimal left pleural effusion or pleural thickening is  noted with associated left lower lobe atelectasis or scarring. The visualized skeletal structures are unremarkable. IMPRESSION: Minimal left pleural effusion or pleural thickening is noted with associated left lower lobe atelectasis or scarring. Electronically Signed   By: Lynwood Landy Raddle M.D.   On: 04/13/2024 15:45   ECHOCARDIOGRAM COMPLETE Result Date: 04/13/2024    ECHOCARDIOGRAM REPORT   Patient Name:   Nathan Castro Date of Exam: 04/13/2024 Medical Rec #:  [id number]     Height:       72.0 in Accession #:    [id number]    Weight:       171.0 lb Date of Birth:  07/08/1945      BSA:          1.993 m Patient Age:    78 years      BP:           165/68 mmHg Patient Gender: M             HR:           54 bpm. Exam Location:  Inpatient Procedure: 2D Echo, Cardiac Doppler and Color Doppler (Both Spectral and Color            Flow Doppler were utilized during procedure). STAT ECHO Indications:    R94.31 Abnormal EKG. New left bundle branch block. ; R07.9*                 Chest pain, unspecified  History:        Patient has no prior history of Echocardiogram examinations.                 Abnormal ECG, Arrythmias:LBBB;  Signs/Symptoms:Chest Pain.  Sonographer:    Ellouise Mose RDCS Referring Phys: [id number] [facility]  Sonographer Comments: Technically difficult study due to poor echo windows. IMPRESSIONS  1. Left ventricular ejection fraction, by estimation, is 20 to 25%. Left ventricular ejection fraction by 2D MOD biplane is 22.3 %. The left ventricle has severely decreased function. The left ventricle demonstrates global hypokinesis. The left ventricular internal cavity size was moderately dilated. There is mild concentric left ventricular hypertrophy. Left ventricular diastolic parameters are consistent with Grade I diastolic dysfunction (impaired relaxation).  2. Right ventricular systolic function is mildly reduced. The right ventricular size is normal. Tricuspid regurgitation signal is inadequate for assessing PA pressure.  3. The mitral valve is normal in structure. No evidence of mitral valve regurgitation. No evidence of mitral stenosis.  4. The aortic valve has an indeterminant number of cusps. There is moderate calcification of the aortic valve. Aortic valve regurgitation is mild to moderate. Mild aortic valve stenosis. Aortic valve mean gradient measures 13.0 mmHg.  5. Aortic dilatation noted. There is mild dilatation of the aortic root, measuring 40 mm. There is borderline dilatation of the ascending aorta, measuring 38 mm.  6. The inferior vena cava is normal in size with greater than 50% respiratory variability, suggesting right atrial pressure of 3 mmHg. FINDINGS  Left Ventricle: Left ventricular ejection fraction, by estimation, is 20 to 25%. Left ventricular ejection fraction by 2D MOD biplane is 22.3 %. The left ventricle has severely decreased function. The left ventricle demonstrates global hypokinesis. The left ventricular internal cavity size was moderately dilated. There is mild concentric left ventricular hypertrophy. Left ventricular diastolic parameters are consistent with Grade I diastolic  dysfunction (impaired relaxation). Right Ventricle: The right ventricular size is normal. No increase in right ventricular wall thickness. Right ventricular systolic function is  mildly reduced. Tricuspid regurgitation signal is inadequate for assessing PA pressure. Left Atrium: Left atrial size was normal in size. Right Atrium: Right atrial size was normal in size. Pericardium: There is no evidence of pericardial effusion. Mitral Valve: The mitral valve is normal in structure. Mild mitral annular calcification. No evidence of mitral valve regurgitation. No evidence of mitral valve stenosis. Tricuspid Valve: The tricuspid valve is normal in structure. Tricuspid valve regurgitation is not demonstrated. Aortic Valve: The aortic valve has an indeterminant number of cusps. There is moderate calcification of the aortic valve. Aortic valve regurgitation is mild to moderate. Aortic regurgitation PHT measures 472 msec. Mild aortic stenosis is present. Aortic valve mean gradient measures 13.0 mmHg. Aortic valve peak gradient measures 21.0 mmHg. Aortic valve area, by VTI measures 2.63 cm. Pulmonic Valve: The pulmonic valve was normal in structure. Pulmonic valve regurgitation is trivial. Aorta: Aortic dilatation noted. There is mild dilatation of the aortic root, measuring 40 mm. There is borderline dilatation of the ascending aorta, measuring 38 mm. Venous: The inferior vena cava is normal in size with greater than 50% respiratory variability, suggesting right atrial pressure of 3 mmHg. IAS/Shunts: No atrial level shunt detected by color flow Doppler.  LEFT VENTRICLE PLAX 2D                        Biplane EF (MOD) LVIDd:         6.75 cm         LV Biplane EF:   Left LVIDs:         5.85 cm                          ventricular LV PW:         1.25 cm                          ejection LV IVS:        1.20 cm                          fraction by LVOT diam:     2.70 cm                          2D MOD LV SV:         126                               biplane is LV SV Index:   63                               22.3 %. LVOT Area:     5.73 cm                                Diastology                                LV e' medial:    4.82 cm/s LV Volumes (MOD)               LV E/e' medial:  9.8 LV vol d, MOD  214.0 ml      LV e' lateral:   3.11 cm/s A2C:                           LV E/e' lateral: 15.2 LV vol d, MOD    305.0 ml A4C: LV vol s, MOD    166.0 ml A2C: LV vol s, MOD    238.0 ml A4C: LV SV MOD A2C:   48.0 ml LV SV MOD A4C:   305.0 ml LV SV MOD BP:    58.1 ml RIGHT VENTRICLE             IVC RV S prime:     11.30 cm/s  IVC diam: 0.80 cm TAPSE (M-mode): 1.2 cm LEFT ATRIUM             Index        RIGHT ATRIUM          Index LA diam:        3.40 cm 1.71 cm/m   RA Area:     7.36 cm LA Vol (A2C):   28.4 ml 14.25 ml/m  RA Volume:   12.40 ml 6.22 ml/m LA Vol (A4C):   40.6 ml 20.37 ml/m LA Biplane Vol: 34.1 ml 17.11 ml/m  AORTIC VALVE AV Area (Vmax):    2.60 cm AV Area (Vmean):   2.71 cm AV Area (VTI):     2.63 cm AV Vmax:           229.00 cm/s AV Vmean:          138.000 cm/s AV VTI:            0.479 m AV Peak Grad:      21.0 mmHg AV Mean Grad:      13.0 mmHg LVOT Vmax:         104.00 cm/s LVOT Vmean:        65.200 cm/s LVOT VTI:          0.220 m LVOT/AV VTI ratio: 0.46 AI PHT:            472 msec  AORTA Ao Root diam: 3.65 cm Ao Asc diam:  3.80 cm MITRAL VALVE MV Area (PHT): 4.49 cm    SHUNTS MV Decel Time: 169 msec    Systemic VTI:  0.22 m MV E velocity: 47.20 cm/s  Systemic Diam: 2.70 cm MV A velocity: 81.30 cm/s MV E/A ratio:  0.58 Dalton McleanMD Electronically signed by Ezra Kanner Signature Date/Time: 04/13/2024/2:29:54 PM    Final     Cardiac Studies   See echo above.   Patient Profile     78 y.o. male patient with newly diagnosed acute systolic HF.  New LBBB.  History of chronic CKD.   Assessment & Plan    Acute systolic HF:  Right and left cath yesterday.  I reviewed and right heart pressures are not elevated.  EDP  OK.  Non obstructive CAD.  Continue Imdur and hydralazine.  Not a candidate for ARB/ACE.  Volume was down so will avoid SGLT2i for now.  Start low dose Coreg.    OK to discharge.  We will arrange TOC follow up   CKD:  Creat unchanged today.  AS:  Mild with mild AI on echo but likely needs to be assessed as an out patient with MRI vs TEE.     HTN:  This is being managed in the context of treating his CHF  Anemia:   Hgb is trending down but there has been no suggestion of active bleeding.     For questions or updates, please contact [facility] Please consult [email or web address] for contact info under Cardiology/STEMI.   Signed, Lynwood Schilling, MD  04/15/2024, 8:07 AM

## 2024-04-15 NOTE — Plan of Care (Signed)

## 2024-04-15 NOTE — Discharge Summary (Signed)
 Discharge Summary   Patient ID: Nathan Castro MRN: [id number]; DOB: 15-Aug-1945  Admit date: 04/13/2024 Discharge date: 04/15/2024  PCP:  Felisa Reece SQUIBB, MD   [facility] Providers Cardiologist:  Lynwood Schilling, MD       Discharge Diagnoses  Principal Problem:   Systolic and diastolic CHF, chronic (HCC) Active Problems:   Acute on chronic systolic (congestive) heart failure (HCC)   Diagnostic Studies/Procedures   Echo 04/13/2024 1. Left ventricular ejection fraction, by estimation, is 20 to 25%. Left  ventricular ejection fraction by 2D MOD biplane is 22.3 %. The left  ventricle has severely decreased function. The left ventricle demonstrates  global hypokinesis. The left  ventricular internal cavity size was moderately dilated. There is mild  concentric left ventricular hypertrophy. Left ventricular diastolic  parameters are consistent with Grade I diastolic dysfunction (impaired  relaxation).   2. Right ventricular systolic function is mildly reduced. The right  ventricular size is normal. Tricuspid regurgitation signal is inadequate  for assessing PA pressure.   3. The mitral valve is normal in structure. No evidence of mitral valve  regurgitation. No evidence of mitral stenosis.   4. The aortic valve has an indeterminant number of cusps. There is  moderate calcification of the aortic valve. Aortic valve regurgitation is  mild to moderate. Mild aortic valve stenosis. Aortic valve mean gradient  measures 13.0 mmHg.   5. Aortic dilatation noted. There is mild dilatation of the aortic root,  measuring 40 mm. There is borderline dilatation of the ascending aorta,  measuring 38 mm.   6. The inferior vena cava is normal in size with greater than 50%  respiratory variability, suggesting right atrial pressure of 3 mmHg.    _____________   History of Present Illness   Nathan Castro is a 78 y.o. male who presents for evaluation of an abnormal EKG. He reports  that he has had no past cardiac history but he really does not go to the doctor very frequently. He said that for months he has been having mid chest discomfort. It comes and goes spontaneously. It happens at rest. It is 5-7 out of 10 in intensity and lasts for minutes at a time. It is substernal and radiates slightly to the left. He had it today in our waiting area. He actually felt presyncopal. He is not having any acute nausea vomiting or diaphoresis with this. He does not have radiation to his left arm or his jaw. He not had this before. He does not describe PND or orthopnea. He has had decreased exercise tolerance over about a year and a half. He is felt more fatigued. He has felt more short of breath during activity such as climbing the stairs. 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 He does have renal insufficiency that is followed. He was seen at [facility] [place]. He is found to have a left bundle branch block and is referred here. I did review their most recent notes. He is followed for dyslipidemia and hypertension. He is have prostate cancer he said he is cancer free. He does report a chest x-ray though I do not see this result.    Hospital Course   Consultants: N/A   Patient presented to the hospital for evaluation of abnormal EKG and chest pain.  EKG showed new left bundle branch block.  Stat echocardiogram obtained on 04/13/2024 showed EF 20 to 25%, global hypokinesis, mild LVH, grade 1 DD, mildly reduced RV  systolic function, mild aortic stenosis, mild dilatation of the aortic root measuring 40 mm.  Etiology behind acute systolic heart failure is unclear.  Patient ultimately underwent a left and right heart cath on 04/14/2024, no obstructive CAD was seen.  LVEDP was normal.  Right heart pressure was not elevated.  Patient was placed on combination of Imdur/hydralazine.  He was found not to be a candidate for ARB/ACE inhibitor.  Low-dose carvedilol was  started.  Further medication titration will be performed as outpatient.  Patient was seen in the morning of 04/15/2024 at which time he was doing well.  Hemoglobin was 10.3 borderline low.  Creatinine 2.03.  He will need a basic metabolic panel on follow-up.     Did the patient have an acute coronary syndrome (MI, NSTEMI, STEMI, etc) this admission?:  No                               Did the patient have a percutaneous coronary intervention (stent / angioplasty)?:  No.        The patient will be scheduled for a TOC follow up appointment in 14 days.  A message has been sent to the [employer] and Scheduling Pool at the office where the patient should be seen for follow up.  _____________  Discharge Vitals Blood pressure 134/62, pulse 76, temperature 98 F (36.7 C), temperature source Oral, resp. rate 18, height 6' 0.5 (1.842 m), weight 77 kg, SpO2 98%.  Filed Weights   04/13/24 1303 04/13/24 1825  Weight: 77.6 kg 77 kg    Labs & Radiologic Studies  CBC Recent Labs    04/14/24 0232 04/15/24 1222  WBC 5.3 6.3  HGB 9.8* 10.3*  HCT 29.4* 31.9*  MCV 79.7* 79.0*  PLT 155 166   Basic Metabolic Panel Recent Labs    89/89/74 0232 04/15/24 1222  NA 137 135  K 3.9 5.2*  CL 108 107  CO2 21* 20*  GLUCOSE 82 84  BUN 39* 34*  CREATININE 2.00* 2.03*  CALCIUM 8.7* 9.0   Liver Function Tests Recent Labs    04/13/24 1432  AST 18  ALT 13  ALKPHOS 30*  BILITOT 1.4*  PROT 7.8  ALBUMIN 3.8   No results for input(s): LIPASE, AMYLASE in the last 72 hours. High Sensitivity Troponin:   Recent Labs  Lab 04/13/24 1432 04/13/24 1826  TROPONINIHS 27* 37*    No results for input(s): TRNPT in the last 720 hours.  BNP Invalid input(s): POCBNP No results for input(s): PROBNP in the last 72 hours.  No results for input(s): BNP in the last 72 hours.  D-Dimer No results for input(s): DDIMER in the last 72 hours. Hemoglobin A1C Recent Labs    04/14/24 0232  HGBA1C 4.8    Fasting Lipid Panel Recent Labs    04/14/24 0232  CHOL 171  HDL 33*  LDLCALC 114*  TRIG 118  CHOLHDL 5.2   No results found for: LIPOA  Thyroid Function Tests Recent Labs    04/14/24 0232  TSH 3.870   _____________  DG Chest 2 View Result Date: 04/13/2024 CLINICAL DATA:  Chest pain. EXAM: CHEST - 2 VIEW COMPARISON:  None Available. FINDINGS: The heart size and mediastinal contours are within normal limits. Right lung is clear. Minimal left pleural effusion or pleural thickening is noted with associated left lower lobe atelectasis or scarring. The visualized skeletal structures are unremarkable. IMPRESSION: Minimal left pleural effusion  or pleural thickening is noted with associated left lower lobe atelectasis or scarring. Electronically Signed   By: Lynwood Landy Raddle M.D.   On: 04/13/2024 15:45   ECHOCARDIOGRAM COMPLETE Result Date: 04/13/2024    ECHOCARDIOGRAM REPORT   Patient Name:   Nathan Castro Date of Exam: 04/13/2024 Medical Rec #:  [id number]     Height:       72.0 in Accession #:    [id number]    Weight:       171.0 lb Date of Birth:  May 28, 1946      BSA:          1.993 m Patient Age:    78 years      BP:           165/68 mmHg Patient Gender: M             HR:           54 bpm. Exam Location:  Inpatient Procedure: 2D Echo, Cardiac Doppler and Color Doppler (Both Spectral and Color            Flow Doppler were utilized during procedure). STAT ECHO Indications:    R94.31 Abnormal EKG. New left bundle branch block. ; R07.9*                 Chest pain, unspecified  History:        Patient has no prior history of Echocardiogram examinations.                 Abnormal ECG, Arrythmias:LBBB; Signs/Symptoms:Chest Pain.  Sonographer:    Ellouise Mose RDCS Referring Phys: [id number] [facility]  Sonographer Comments: Technically difficult study due to poor echo windows. IMPRESSIONS  1. Left ventricular ejection fraction, by estimation, is 20 to 25%. Left ventricular ejection fraction by 2D MOD  biplane is 22.3 %. The left ventricle has severely decreased function. The left ventricle demonstrates global hypokinesis. The left ventricular internal cavity size was moderately dilated. There is mild concentric left ventricular hypertrophy. Left ventricular diastolic parameters are consistent with Grade I diastolic dysfunction (impaired relaxation).  2. Right ventricular systolic function is mildly reduced. The right ventricular size is normal. Tricuspid regurgitation signal is inadequate for assessing PA pressure.  3. The mitral valve is normal in structure. No evidence of mitral valve regurgitation. No evidence of mitral stenosis.  4. The aortic valve has an indeterminant number of cusps. There is moderate calcification of the aortic valve. Aortic valve regurgitation is mild to moderate. Mild aortic valve stenosis. Aortic valve mean gradient measures 13.0 mmHg.  5. Aortic dilatation noted. There is mild dilatation of the aortic root, measuring 40 mm. There is borderline dilatation of the ascending aorta, measuring 38 mm.  6. The inferior vena cava is normal in size with greater than 50% respiratory variability, suggesting right atrial pressure of 3 mmHg. FINDINGS  Left Ventricle: Left ventricular ejection fraction, by estimation, is 20 to 25%. Left ventricular ejection fraction by 2D MOD biplane is 22.3 %. The left ventricle has severely decreased function. The left ventricle demonstrates global hypokinesis. The left ventricular internal cavity size was moderately dilated. There is mild concentric left ventricular hypertrophy. Left ventricular diastolic parameters are consistent with Grade I diastolic dysfunction (impaired relaxation). Right Ventricle: The right ventricular size is normal. No increase in right ventricular wall thickness. Right ventricular systolic function is mildly reduced. Tricuspid regurgitation signal is inadequate for assessing PA pressure. Left Atrium: Left atrial size was normal in size.  Right Atrium: Right atrial size was normal in size. Pericardium: There is no evidence of pericardial effusion. Mitral Valve: The mitral valve is normal in structure. Mild mitral annular calcification. No evidence of mitral valve regurgitation. No evidence of mitral valve stenosis. Tricuspid Valve: The tricuspid valve is normal in structure. Tricuspid valve regurgitation is not demonstrated. Aortic Valve: The aortic valve has an indeterminant number of cusps. There is moderate calcification of the aortic valve. Aortic valve regurgitation is mild to moderate. Aortic regurgitation PHT measures 472 msec. Mild aortic stenosis is present. Aortic valve mean gradient measures 13.0 mmHg. Aortic valve peak gradient measures 21.0 mmHg. Aortic valve area, by VTI measures 2.63 cm. Pulmonic Valve: The pulmonic valve was normal in structure. Pulmonic valve regurgitation is trivial. Aorta: Aortic dilatation noted. There is mild dilatation of the aortic root, measuring 40 mm. There is borderline dilatation of the ascending aorta, measuring 38 mm. Venous: The inferior vena cava is normal in size with greater than 50% respiratory variability, suggesting right atrial pressure of 3 mmHg. IAS/Shunts: No atrial level shunt detected by color flow Doppler.  LEFT VENTRICLE PLAX 2D                        Biplane EF (MOD) LVIDd:         6.75 cm         LV Biplane EF:   Left LVIDs:         5.85 cm                          ventricular LV PW:         1.25 cm                          ejection LV IVS:        1.20 cm                          fraction by LVOT diam:     2.70 cm                          2D MOD LV SV:         126                              biplane is LV SV Index:   63                               22.3 %. LVOT Area:     5.73 cm                                Diastology                                LV e' medial:    4.82 cm/s LV Volumes (MOD)               LV E/e' medial:  9.8 LV vol d, MOD    214.0 ml      LV e' lateral:   3.11  cm/s A2C:  LV E/e' lateral: 15.2 LV vol d, MOD    305.0 ml A4C: LV vol s, MOD    166.0 ml A2C: LV vol s, MOD    238.0 ml A4C: LV SV MOD A2C:   48.0 ml LV SV MOD A4C:   305.0 ml LV SV MOD BP:    58.1 ml RIGHT VENTRICLE             IVC RV S prime:     11.30 cm/s  IVC diam: 0.80 cm TAPSE (M-mode): 1.2 cm LEFT ATRIUM             Index        RIGHT ATRIUM          Index LA diam:        3.40 cm 1.71 cm/m   RA Area:     7.36 cm LA Vol (A2C):   28.4 ml 14.25 ml/m  RA Volume:   12.40 ml 6.22 ml/m LA Vol (A4C):   40.6 ml 20.37 ml/m LA Biplane Vol: 34.1 ml 17.11 ml/m  AORTIC VALVE AV Area (Vmax):    2.60 cm AV Area (Vmean):   2.71 cm AV Area (VTI):     2.63 cm AV Vmax:           229.00 cm/s AV Vmean:          138.000 cm/s AV VTI:            0.479 m AV Peak Grad:      21.0 mmHg AV Mean Grad:      13.0 mmHg LVOT Vmax:         104.00 cm/s LVOT Vmean:        65.200 cm/s LVOT VTI:          0.220 m LVOT/AV VTI ratio: 0.46 AI PHT:            472 msec  AORTA Ao Root diam: 3.65 cm Ao Asc diam:  3.80 cm MITRAL VALVE MV Area (PHT): 4.49 cm    SHUNTS MV Decel Time: 169 msec    Systemic VTI:  0.22 m MV E velocity: 47.20 cm/s  Systemic Diam: 2.70 cm MV A velocity: 81.30 cm/s MV E/A ratio:  0.58 Dalton McleanMD Electronically signed by Ezra Kanner Signature Date/Time: 04/13/2024/2:29:54 PM    Final     Disposition Pt is being discharged home today in good condition.  Follow-up Plans & Appointments  Follow-up Information     Felisa Reece SQUIBB, MD Follow up on 04/19/2024.   Specialty: Internal Medicine Why: 12 noon  for hospital follow up Contact information: [address] [place] [address] [phone]         Janene Boer, [place] Follow up on 04/25/2024.   Specialties: Cardiology, Radiology Why: 8:50AM. Cardiology post hospital follow up Contact information: [address] [place] [address] [phone]         Lavona Agent, MD Follow up on 07/18/2024.    Specialty: Cardiology Why: @1 :79EF. Cardiology follow up Contact information: [address] [place] [address] [phone]                  Discharge Medications Allergies as of 04/15/2024       Reactions   Percocet [oxycodone -acetaminophen ] Nausea And Vomiting        Medication List     STOP taking these medications    amLODipine -benazepril  10-40 MG capsule Commonly known as: LOTREL       TAKE these medications  aspirin EC 81 MG tablet Take 1 tablet (81 mg total) by mouth daily. Swallow whole. Start taking on: April 16, 2024   carvedilol 3.125 MG tablet Commonly known as: COREG Take 1 tablet (3.125 mg total) by mouth 2 (two) times daily with a meal.   ezetimibe 10 MG tablet Commonly known as: ZETIA Take 1 tablet (10 mg total) by mouth daily. Start taking on: April 16, 2024   ferrous sulfate 325 (65 FE) MG tablet Take 325 mg by mouth daily with breakfast.   FISH OIL PO Take 1 capsule by mouth daily.   hydrALAZINE 100 MG tablet Commonly known as: APRESOLINE Take 1 tablet (100 mg total) by mouth every 8 (eight) hours.   isosorbide mononitrate 30 MG 24 hr tablet Commonly known as: IMDUR Take 1 tablet (30 mg total) by mouth daily. Start taking on: April 16, 2024   MAGNESIUM PO Take 1 tablet by mouth daily.   multivitamin capsule Take 1 capsule by mouth daily.   Vitamin D3 1.25 MG (50000 UT) Caps Take by mouth.         Outstanding Labs/Studies  BMET and CBC on follow up  Duration of Discharge Encounter: APP Time: 15 minutes   Signed, Scot Ford, PA 04/15/2024, 2:25 PM

## 2024-04-16 ENCOUNTER — Encounter (HOSPITAL_COMMUNITY): Payer: Self-pay | Admitting: Cardiology

## 2024-04-17 ENCOUNTER — Telehealth: Payer: Self-pay | Admitting: Cardiology

## 2024-04-17 LAB — LIPOPROTEIN A (LPA): Lipoprotein (a): 33.5 nmol/L — ABNORMAL HIGH (ref <75.0)

## 2024-04-17 NOTE — Telephone Encounter (Signed)
 Pt c/o swelling: STAT is pt has developed SOB within 24 hours  How much weight have you gained and in what time span? 2lbs since Friday  If swelling, where is the swelling located? Toes, feet, and ankle  Are you currently taking a fluid pill? No  Are you currently SOB? No  Do you have a log of your daily weights (if so, list)? 172  Have you gained 3 pounds in a day or 5 pounds in a week? 2 lbs  Have you traveled recently? No  Patient stated he can hear his heartbeat in his ears. Patient stated he has been hearing this noise since last night. Patient stated he feel his heart in his chest, but it is not a feeling of chest pain. Patient stated he did have a slight headache today, but it went away. Please advise.

## 2024-04-17 NOTE — Telephone Encounter (Signed)
 Spoke with pt regarding edema and heart beating in his ears. Pt stated his feet, toes and ankles were swollen earlier but that he elevated his feet and the swelling went away except for his toes. Pt stated he is also hearing his heart beat in his ears that comes and goes and gets quite and then louder, not necessarily with activity. Pt denied any chest pain or shortness of breath. Pt was offered an appointment tomorrow with Glendia Ferrier, PA-C however the pt stated he thinks he just needs to adjust to his medications. Pt has hosptial follow up appointment on 10/21.  Pt was given ED precautions and told that Dr. Lavona would be notified of his symptoms. Pt verbalized understanding. All questions if any were answered.

## 2024-04-17 NOTE — Telephone Encounter (Signed)
 Called Patient 13/13/25 12:45 LVM

## 2024-04-18 ENCOUNTER — Ambulatory Visit: Admitting: Physician Assistant

## 2024-04-18 LAB — POCT I-STAT EG7
Acid-base deficit: 6 mmol/L — ABNORMAL HIGH (ref 0.0–2.0)
Acid-base deficit: 6 mmol/L — ABNORMAL HIGH (ref 0.0–2.0)
Bicarbonate: 19 mmol/L — ABNORMAL LOW (ref 20.0–28.0)
Bicarbonate: 19 mmol/L — ABNORMAL LOW (ref 20.0–28.0)
Calcium, Ion: 1.15 mmol/L (ref 1.15–1.40)
Calcium, Ion: 1.23 mmol/L (ref 1.15–1.40)
HCT: 28 % — ABNORMAL LOW (ref 39.0–52.0)
HCT: 30 % — ABNORMAL LOW (ref 39.0–52.0)
Hemoglobin: 10.2 g/dL — ABNORMAL LOW (ref 13.0–17.0)
Hemoglobin: 9.5 g/dL — ABNORMAL LOW (ref 13.0–17.0)
O2 Saturation: 72 %
O2 Saturation: 73 %
Potassium: 4 mmol/L (ref 3.5–5.1)
Potassium: 4.2 mmol/L (ref 3.5–5.1)
Sodium: 141 mmol/L (ref 135–145)
Sodium: 143 mmol/L (ref 135–145)
TCO2: 20 mmol/L — ABNORMAL LOW (ref 22–32)
TCO2: 20 mmol/L — ABNORMAL LOW (ref 22–32)
pCO2, Ven: 34 mmHg — ABNORMAL LOW (ref 44–60)
pCO2, Ven: 34.1 mmHg — ABNORMAL LOW (ref 44–60)
pH, Ven: 7.355 (ref 7.25–7.43)
pH, Ven: 7.355 (ref 7.25–7.43)
pO2, Ven: 39 mmHg (ref 32–45)
pO2, Ven: 40 mmHg (ref 32–45)

## 2024-04-18 NOTE — Telephone Encounter (Signed)
 Patient contacted by clinical team regarding discharge from hospital on 04/15/2024.  Patient understands to follow up with provider Nathan Castro on 04/25/2024 at 8:50 am at [facility] office. Patient understands discharge instructions? yes  Patient understands medications and how to take them? yes Patient understands to bring all medications to this visit? yes  Patient understands all medications (except pain medications) will be filled by your Cardiologist AFTER your first appointment. Yes Patient reports appropriate help at home with ADL's and has DME at this time. Yes Patient reports they have been contacted by home health if ordered.  no Ask patient:  Are you enrolled in My Chart Yes If no ask patient if they would like to enroll.    Postop: Structural/Cath/Device/(other cardiac invasive procedures) patients:               What is your wound status? Any signs/ symptoms of infection (Temp, redness/ red streaks, swelling, purulent drainage, foul odor or smell)? No                Please do not place any creams/ lotions/ or antibiotic ointment on any surgical incisions/ wounds without physician approval.

## 2024-04-19 ENCOUNTER — Telehealth: Payer: Self-pay | Admitting: Physician Assistant

## 2024-04-19 NOTE — Telephone Encounter (Signed)
 Spoke with pt. Pt stated he is doing a lot better and is ok to wait to be seen on the 21st. Pt told to call us  if he has any issues or if his condition worsens. Pt also given ED precautions. Pt verbalized understanding. All questions if any were answered.

## 2024-04-19 NOTE — Telephone Encounter (Signed)
 Pt is requesting a copy of the procedure details. Please advise.

## 2024-04-19 NOTE — Telephone Encounter (Signed)
 Spoke with patient's wife. They are requesting cath report. They came by office yesterday and were given different paperwork ?hospital AVS. She said they need this before 04/25/24 appt -- had previously advised could get copy at this appointment. Informed wife a release of info form would need to be signed. Advised could come by to sign here OR call [facility] -- provided phone # and email. Also sent this via MyChart

## 2024-04-25 ENCOUNTER — Ambulatory Visit: Attending: Physician Assistant | Admitting: Physician Assistant

## 2024-04-25 VITALS — BP 150/70 | HR 58 | Ht 72.5 in | Wt 177.0 lb

## 2024-04-25 DIAGNOSIS — E785 Hyperlipidemia, unspecified: Secondary | ICD-10-CM | POA: Insufficient documentation

## 2024-04-25 DIAGNOSIS — I428 Other cardiomyopathies: Secondary | ICD-10-CM | POA: Insufficient documentation

## 2024-04-25 DIAGNOSIS — N183 Chronic kidney disease, stage 3 unspecified: Secondary | ICD-10-CM | POA: Diagnosis present

## 2024-04-25 LAB — BASIC METABOLIC PANEL WITH GFR
BUN/Creatinine Ratio: 23 (ref 10–24)
BUN: 54 mg/dL — ABNORMAL HIGH (ref 8–27)
CO2: 19 mmol/L — ABNORMAL LOW (ref 20–29)
Calcium: 9.3 mg/dL (ref 8.6–10.2)
Chloride: 105 mmol/L (ref 96–106)
Creatinine, Ser: 2.38 mg/dL — ABNORMAL HIGH (ref 0.76–1.27)
Glucose: 99 mg/dL (ref 70–99)
Potassium: 5.3 mmol/L — ABNORMAL HIGH (ref 3.5–5.2)
Sodium: 139 mmol/L (ref 134–144)
eGFR: 27 mL/min/1.73 — ABNORMAL LOW (ref >59)

## 2024-04-25 MED ORDER — CARVEDILOL 6.25 MG PO TABS: 6.2500 mg | ORAL_TABLET | Freq: Two times a day (BID) | ORAL | 3 refills | Status: AC

## 2024-04-25 NOTE — Patient Instructions (Signed)
 Medication Instructions:    START TAKING:  COREG 6.25 MG TWICE A DAY    *If you need a refill on your cardiac medications before your next appointment, please call your pharmacy*   Lab Work:   PLEASE GO DOWN STAIRS  [facility]  FIRST FLOOR   ( GET OFF ELEVATORS WALK TOWARDS WAITING AREA LAB LOCATED BY PHARMACY):  BMET TODAY    RETURN FASTING 4 WEEKS  : LIPIDS AND  LFT TODAY      If you have labs (blood work) drawn today and your tests are completely normal, you will receive your results only by: MyChart Message (if you have MyChart) OR A paper copy in the mail If you have any lab test that is abnormal or we need to change your treatment, we will call you to review the results.  Testing/Procedures: Your physician has requested that you have an echocardiogram. Echocardiography is a painless test that uses sound waves to create images of your heart. It provides your doctor with information about the size and shape of your heart and how well your heart's chambers and valves are working. This procedure takes approximately one hour. There are no restrictions for this procedure. Please do NOT wear cologne, perfume, aftershave, or lotions (deodorant is allowed). Please arrive 15 minutes prior to your appointment time.  Please note: We ask at that you not bring children with you during ultrasound (echo/ vascular) testing. Due to room size and safety concerns, children are not allowed in the ultrasound rooms during exams. Our front office staff cannot provide observation of children in our lobby area while testing is being conducted. An adult accompanying a patient to their appointment will only be allowed in the ultrasound room at the discretion of the ultrasound technician under special circumstances. We apologize for any inconvenience.      Follow-Up: At [facility], you and your health needs are our priority.  As part of our continuing mission to provide you with exceptional heart  care, our providers are all part of one team.  This team includes your primary Cardiologist (physician) and Advanced Practice Providers or APPs (Physician Assistants and Nurse Practitioners) who all work together to provide you with the care you need, when you need it.   Your next appointment:  6 week(s) WITH HAO MENG FOR MEDICATIONS  ADJUSTMENTS     We recommend signing up for the patient portal called MyChart.  Sign up information is provided on this After Visit Summary.  MyChart is used to connect with patients for Virtual Visits (Telemedicine).  Patients are able to view lab/test results, encounter notes, upcoming appointments, etc.  Non-urgent messages can be sent to your provider as well.   To learn more about what you can do with MyChart, go to [email or web address].   Other Instructions

## 2024-04-25 NOTE — Progress Notes (Unsigned)
 Cardiology Office Note   Date:  04/27/2024  ID:  Ernestine Langworthy, DOB 02/04/1946, MRN [id number] PCP: Felisa Reece SQUIBB, MD  [facility] Providers Cardiologist:  Lynwood Schilling, MD     History of Present Illness Ishaq Maffei is a 78 y.o. male with past medical history of hypertension, CKD and recently diagnosed nonischemic cardiomyopathy.  Patient was initially sent to the hospital for evaluation of abnormal EKG.  He has no prior cardiac history and rarely goes to the doctors office.  EKG showed new left bundle branch block.  Stat echocardiogram obtained on 04/13/2024 showed EF 20 to 25%, global hypokinesis, mild LVH, grade 1 DD, mildly reduced RV systolic function, mild dilatation of the aortic root measuring at 40 mm.  Etiology behind acute systolic heart failure was unclear.  Patient ultimately underwent a left and right heart cath on 04/14/2024 that showed only 40% midLAD lesion.  LVEDP was normal.  The right heart pressure was not elevated.  Patient was placed on combination of Imdur and hydralazine.  He was felt not to be a candidate for ACE inhibitor/ARB for elevated creatinine.  Low-dose carvedilol was started.  Low-dose carvedilol was started prior to discharge.   Patient presents today for follow-up.  Blood pressure remain elevated on exam however patient says he was seen downstairs with other patients therefore blood pressure was elevated.  Even on repeat manual recheck by myself, blood pressure remain 150/70.  I will increase carvedilol further to 6.25 mg twice a day.  I will bring the patient back in 6 weeks for final blood pressure medication titration.  We discussed possibility of adding on Jardiance or Farxiga, however patient does not wish to start on any medication that has potential possibility of affecting kidney function.  I will obtain a basic metabolic panel today to recheck his renal function and electrolyte.  He will need fasting lipid panel and LFT in 4 weeks.  I will  schedule him echocardiogram a few days prior to his next follow-up with Dr. Schilling.  His heart murmur is louder than expected, therefore I ended up scheduling a complete echocardiogram rather than limited echocardiogram.  ROS:   He denies chest pain, palpitations, dyspnea, pnd, orthopnea, n, v, dizziness, syncope, edema, weight gain, or early satiety. All other systems reviewed and are otherwise negative except as noted above.    Studies Reviewed      Cardiac Studies & Procedures   ______________________________________________________________________________________________ CARDIAC CATHETERIZATION  CARDIAC CATHETERIZATION 04/14/2024  Conclusion   Mid LAD lesion is 40% stenosed.  HEMODYNAMICS: RA:       2 mmHg (mean) RV:       30/1, 2 mmHg PA:       30/8 mmHg (13 mean) PCWP: 7 mmHg (mean)  Estimated Fick CO/CI   7.85L/min, 3.99L/min/m2   IMPRESSION: Near normal coronary arteriography, difficult engagement with root size and RCA take off Normal filling pressures and cardiac output by assumed Fick No pulmonary hypertension  RECOMMENDATIONS: Proceed with further valve workup  Findings Coronary Findings Diagnostic  Dominance: Right  Left Main Supplies the LAD and Lcx. Angiographically normal.  Left Anterior Descending Supplies 2 large diagonal vessels. 40% mild mid LAD stenosis, 40% ostial D1 disease, otherwise mild luminal irregularities. Mid LAD lesion is 40% stenosed.  Left Circumflex Supplies multiple moderate OM branches. Mild luminal irregularities  Right Coronary Artery Tortuous, dominant for posterior circulation. Supplies the PDA and Pl branches. Mild luminal irregularities  Intervention  No interventions have been documented.  ECHOCARDIOGRAM  ECHOCARDIOGRAM COMPLETE 04/13/2024  Narrative ECHOCARDIOGRAM REPORT    Patient Name:   Nathan Castro Date of Exam: 04/13/2024 Medical Rec #:  [id number]     Height:       72.0 in Accession #:     [id number]    Weight:       171.0 lb Date of Birth:  08/09/1945      BSA:          1.993 m Patient Age:    78 years      BP:           165/68 mmHg Patient Gender: M             HR:           54 bpm. Exam Location:  Inpatient  Procedure: 2D Echo, Cardiac Doppler and Color Doppler (Both Spectral and Color Flow Doppler were utilized during procedure).  STAT ECHO  Indications:    R94.31 Abnormal EKG. New left bundle branch block. ; R07.9* Chest pain, unspecified  History:        Patient has no prior history of Echocardiogram examinations. Abnormal ECG, Arrythmias:LBBB; Signs/Symptoms:Chest Pain.  Sonographer:    Ellouise Mose RDCS Referring Phys: [id number] [facility]   Sonographer Comments: Technically difficult study due to poor echo windows. IMPRESSIONS   1. Left ventricular ejection fraction, by estimation, is 20 to 25%. Left ventricular ejection fraction by 2D MOD biplane is 22.3 %. The left ventricle has severely decreased function. The left ventricle demonstrates global hypokinesis. The left ventricular internal cavity size was moderately dilated. There is mild concentric left ventricular hypertrophy. Left ventricular diastolic parameters are consistent with Grade I diastolic dysfunction (impaired relaxation). 2. Right ventricular systolic function is mildly reduced. The right ventricular size is normal. Tricuspid regurgitation signal is inadequate for assessing PA pressure. 3. The mitral valve is normal in structure. No evidence of mitral valve regurgitation. No evidence of mitral stenosis. 4. The aortic valve has an indeterminant number of cusps. There is moderate calcification of the aortic valve. Aortic valve regurgitation is mild to moderate. Mild aortic valve stenosis. Aortic valve mean gradient measures 13.0 mmHg. 5. Aortic dilatation noted. There is mild dilatation of the aortic root, measuring 40 mm. There is borderline dilatation of the ascending aorta, measuring 38 mm. 6.  The inferior vena cava is normal in size with greater than 50% respiratory variability, suggesting right atrial pressure of 3 mmHg.  FINDINGS Left Ventricle: Left ventricular ejection fraction, by estimation, is 20 to 25%. Left ventricular ejection fraction by 2D MOD biplane is 22.3 %. The left ventricle has severely decreased function. The left ventricle demonstrates global hypokinesis. The left ventricular internal cavity size was moderately dilated. There is mild concentric left ventricular hypertrophy. Left ventricular diastolic parameters are consistent with Grade I diastolic dysfunction (impaired relaxation).  Right Ventricle: The right ventricular size is normal. No increase in right ventricular wall thickness. Right ventricular systolic function is mildly reduced. Tricuspid regurgitation signal is inadequate for assessing PA pressure.  Left Atrium: Left atrial size was normal in size.  Right Atrium: Right atrial size was normal in size.  Pericardium: There is no evidence of pericardial effusion.  Mitral Valve: The mitral valve is normal in structure. Mild mitral annular calcification. No evidence of mitral valve regurgitation. No evidence of mitral valve stenosis.  Tricuspid Valve: The tricuspid valve is normal in structure. Tricuspid valve regurgitation is not demonstrated.  Aortic Valve: The aortic valve has an indeterminant number  of cusps. There is moderate calcification of the aortic valve. Aortic valve regurgitation is mild to moderate. Aortic regurgitation PHT measures 472 msec. Mild aortic stenosis is present. Aortic valve mean gradient measures 13.0 mmHg. Aortic valve peak gradient measures 21.0 mmHg. Aortic valve area, by VTI measures 2.63 cm.  Pulmonic Valve: The pulmonic valve was normal in structure. Pulmonic valve regurgitation is trivial.  Aorta: Aortic dilatation noted. There is mild dilatation of the aortic root, measuring 40 mm. There is borderline dilatation of the  ascending aorta, measuring 38 mm.  Venous: The inferior vena cava is normal in size with greater than 50% respiratory variability, suggesting right atrial pressure of 3 mmHg.  IAS/Shunts: No atrial level shunt detected by color flow Doppler.   LEFT VENTRICLE PLAX 2D                        Biplane EF (MOD) LVIDd:         6.75 cm         LV Biplane EF:   Left LVIDs:         5.85 cm                          ventricular LV PW:         1.25 cm                          ejection LV IVS:        1.20 cm                          fraction by LVOT diam:     2.70 cm                          2D MOD LV SV:         126                              biplane is LV SV Index:   63                               22.3 %. LVOT Area:     5.73 cm Diastology LV e' medial:    4.82 cm/s LV Volumes (MOD)               LV E/e' medial:  9.8 LV vol d, MOD    214.0 ml      LV e' lateral:   3.11 cm/s A2C:                           LV E/e' lateral: 15.2 LV vol d, MOD    305.0 ml A4C: LV vol s, MOD    166.0 ml A2C: LV vol s, MOD    238.0 ml A4C: LV SV MOD A2C:   48.0 ml LV SV MOD A4C:   305.0 ml LV SV MOD BP:    58.1 ml  RIGHT VENTRICLE             IVC RV S prime:     11.30 cm/s  IVC diam: 0.80 cm TAPSE (M-mode): 1.2 cm  LEFT ATRIUM  Index        RIGHT ATRIUM          Index LA diam:        3.40 cm 1.71 cm/m   RA Area:     7.36 cm LA Vol (A2C):   28.4 ml 14.25 ml/m  RA Volume:   12.40 ml 6.22 ml/m LA Vol (A4C):   40.6 ml 20.37 ml/m LA Biplane Vol: 34.1 ml 17.11 ml/m AORTIC VALVE AV Area (Vmax):    2.60 cm AV Area (Vmean):   2.71 cm AV Area (VTI):     2.63 cm AV Vmax:           229.00 cm/s AV Vmean:          138.000 cm/s AV VTI:            0.479 m AV Peak Grad:      21.0 mmHg AV Mean Grad:      13.0 mmHg LVOT Vmax:         104.00 cm/s LVOT Vmean:        65.200 cm/s LVOT VTI:          0.220 m LVOT/AV VTI ratio: 0.46 AI PHT:            472 msec  AORTA Ao Root diam: 3.65 cm Ao Asc  diam:  3.80 cm  MITRAL VALVE MV Area (PHT): 4.49 cm    SHUNTS MV Decel Time: 169 msec    Systemic VTI:  0.22 m MV E velocity: 47.20 cm/s  Systemic Diam: 2.70 cm MV A velocity: 81.30 cm/s MV E/A ratio:  0.58  Dalton McleanMD Electronically signed by Ezra Kanner Signature Date/Time: 04/13/2024/2:29:54 PM    Final          ______________________________________________________________________________________________      Risk Assessment/Calculations          Physical Exam VS:  BP (!) 150/70   Pulse (!) 58   Ht 6' 0.5 (1.842 m)   Wt 177 lb (80.3 kg)   SpO2 95%   BMI 23.68 kg/m        Wt Readings from Last 3 Encounters:  04/25/24 177 lb (80.3 kg)  04/13/24 169 lb 12.1 oz (77 kg)  04/13/24 171 lb (77.6 kg)    GEN: Well nourished, well developed in no acute distress NECK: No JVD; No carotid bruits CARDIAC: RRR, no murmurs, rubs, gallops RESPIRATORY:  Clear to auscultation without rales, wheezing or rhonchi  ABDOMEN: Soft, non-tender, non-distended EXTREMITIES:  No edema; No deformity   ASSESSMENT AND PLAN  Nonischemic cardiomyopathy: Cardiac catheterization only revealed 40% mid LAD lesion.  The patient is on carvedilol, Imdur and hydralazine.  I discussed with the patient possibility of adding Jardiance or Farxiga however he does not wish to consider any medication that can affect his renal function.  Obtain basic metabolic panel to reassess renal function.  Plan to obtain repeat echocardiogram prior to his next follow-up with Dr. Lavona.  Hyperlipidemia: Obtain fasting lipid panel and LFT in 4 weeks  CKD stage III: Obtain basic metabolic panel.       Dispo: Follow-up with me in 6 weeks and follow-up with Dr. Lavona in 4 months.  Signed, Scot Ford, PA

## 2024-04-26 ENCOUNTER — Ambulatory Visit: Payer: Self-pay | Admitting: Physician Assistant

## 2024-04-26 NOTE — Telephone Encounter (Signed)
 LVM asking pt to call our office to discuss lab results. 1st attempt

## 2024-04-26 NOTE — Progress Notes (Signed)
 Kidney function slightly worse, Nathan Castro is not on any medications that can affect the kidney function. Recommend repeat BMET in 4 weeks to make sure renal function stablize.

## 2024-04-27 ENCOUNTER — Encounter: Payer: Self-pay | Admitting: Physician Assistant

## 2024-04-28 ENCOUNTER — Telehealth: Payer: Self-pay

## 2024-04-28 ENCOUNTER — Other Ambulatory Visit: Payer: Self-pay

## 2024-04-28 DIAGNOSIS — Z79899 Other long term (current) drug therapy: Secondary | ICD-10-CM

## 2024-04-28 NOTE — Telephone Encounter (Signed)
 Spoke with pt regarding lab results. Pt verbalized understanding and agreed to repeat labs in 4 weeks. Lab ordered placed.

## 2024-05-29 ENCOUNTER — Telehealth: Payer: Self-pay | Admitting: Cardiology

## 2024-05-29 NOTE — Telephone Encounter (Signed)
 Pt spouse called to clarify appt regarding blood work and would like a c/b please advise

## 2024-05-29 NOTE — Telephone Encounter (Signed)
 Spoke with Nathan Castro and his wife, regarding his appt and blood work. Nathan Castro appointments reviewed and verbalizes understanding. Lab work order already placed and reviewed locations with patient. Nathan Castro inquiring about BP and medications. Stated he had a low BP episode (121/70) the other morning and felt weird but felt better after he ate and drank something. Advised Nathan Castro to drink fluids, eat healthy and elevate feet if he feels this way again. Nathan Castro also states he only takes 3.125 Carvedilol  and does not want to take 6.25 mg twice a day. Educated pt MD and PA  would be updated and made aware of conversation and Nathan Castro medication regimen.

## 2024-05-30 NOTE — Telephone Encounter (Signed)
 Spoke with pt and let him know that Dr. Lavona did not have any new suggestions. Pt verbalized understanding. All questions if any were answered.

## 2024-05-31 LAB — HEPATIC FUNCTION PANEL
ALT: 10 IU/L (ref 0–44)
AST: 16 IU/L (ref 0–40)
Albumin: 4.4 g/dL (ref 3.8–4.8)
Alkaline Phosphatase: 33 IU/L — ABNORMAL LOW (ref 47–123)
Bilirubin Total: 1.1 mg/dL (ref 0.0–1.2)
Bilirubin, Direct: 0.33 mg/dL (ref 0.00–0.40)
Total Protein: 7.3 g/dL (ref 6.0–8.5)

## 2024-05-31 LAB — LIPID PANEL
Chol/HDL Ratio: 3.2 ratio (ref 0.0–5.0)
Cholesterol, Total: 147 mg/dL (ref 100–199)
HDL: 46 mg/dL (ref >39)
LDL Chol Calc (NIH): 71 mg/dL (ref 0–99)
Triglycerides: 179 mg/dL — ABNORMAL HIGH (ref 0–149)
VLDL Cholesterol Cal: 30 mg/dL (ref 5–40)

## 2024-06-13 ENCOUNTER — Ambulatory Visit (HOSPITAL_COMMUNITY)
Admission: RE | Admit: 2024-06-13 | Discharge: 2024-06-13 | Disposition: A | Source: Ambulatory Visit | Attending: Physician Assistant

## 2024-06-13 DIAGNOSIS — I420 Dilated cardiomyopathy: Secondary | ICD-10-CM

## 2024-06-13 DIAGNOSIS — I428 Other cardiomyopathies: Secondary | ICD-10-CM

## 2024-06-13 LAB — ECHOCARDIOGRAM COMPLETE
AR max vel: 2.97 cm2
AV Area VTI: 2.96 cm2
AV Area mean vel: 2.74 cm2
AV Mean grad: 7.4 mmHg
AV Peak grad: 13.9 mmHg
Ao pk vel: 1.87 m/s
Area-P 1/2: 3.48 cm2
P 1/2 time: 369 ms
S' Lateral: 5.2 cm

## 2024-06-16 ENCOUNTER — Telehealth: Payer: Self-pay | Admitting: Cardiology

## 2024-06-16 NOTE — Telephone Encounter (Signed)
 Patient is calling in about his results. Please advise

## 2024-06-16 NOTE — Telephone Encounter (Signed)
 Spoke to patient he stated he applied for [employer] benefits and was denied.Stated he is appealing and needs a letter from Dr.Hochrein.He needs letter to say his diagnosis of chronic systolic and diastolic CHF.He needs letter to state his previous EF of 20 to 25 %.He would also like a copy of 04/13/24 echo report.Stated he needs anything to help with this appeal.Advised I will send message to Dr.Hochrein.

## 2024-06-20 NOTE — Telephone Encounter (Signed)
 Letter written and sent to pt by Channing Morones, LPN.

## 2024-07-16 NOTE — Progress Notes (Unsigned)
 " Cardiology Office Note:   Date:  07/18/2024  ID:  Nathan Castro, DOB [birth year], MRN [id number] PCP: Felisa Reece SQUIBB, MD  [facility] Providers Cardiologist:  Lynwood Schilling, MD {  History of Present Illness:   Nathan Castro is a 79 y.o. male with past medical history of hypertension, CKD and recently diagnosed nonischemic cardiomyopathy.  The patient was initially sent to the hospital for evaluation of abnormal EKG.  EKG showed new left bundle branch block.  Stat echocardiogram obtained on 04/13/2024 showed EF 20 to 25%, global hypokinesis, mild LVH, grade 1 DD, mildly reduced RV systolic function, mild dilatation of the aortic root measuring at 40 mm.  The etiology behind acute systolic heart failure was unclear.  The patient ultimately underwent a left and right heart cath on 04/14/2024 that showed only 40% mid LAD lesion.  The LVEDP was normal.  The right heart pressure was not elevated.  The patient was placed on combination of Imdur  and hydralazine .  He was felt not to be a candidate for ACE inhibitor/ARB for elevated creatinine.  Low-dose carvedilol  was started.    He presents for follow up.  He reports continued dyspnea.  He says he is SOB walking up his stairs.  (Class III).  He denies PND or orthopnea.  He is not having any new palpitations and has had no chest pressure, neck or arm discomfort.  He does get dizziness and lightheadedness with standing.  He has not had any frank syncope.   Of note echocardiography in October 2025 did demonstrate his EF was slightly improved to 20 to 30%.  He had moderate calcification of the aortic valve with moderate aortic insufficiency.  ROS: As stated in the HPI and negative for all other systems.  Studies Reviewed:    EKG:     NA  Risk Assessment/Calculations:          Physical Exam:   VS:  BP (!) 150/60   Pulse 72    Wt Readings from Last 3 Encounters:  04/25/24 177 lb (80.3 kg)  04/13/24 169 lb 12.1 oz (77 kg)  04/13/24 171 lb  (77.6 kg)     GEN: Well nourished, well developed in no acute distress NECK: No JVD; No carotid bruits CARDIAC: RRR, 2 out of 6 apical systolic murmur radiating slightly at the aortic outflow tract, no diastolic murmurs, rubs, gallops RESPIRATORY:  Clear to auscultation without rales, wheezing or rhonchi  ABDOMEN: Soft, non-tender, non-distended EXTREMITIES:  No edema; No deformity   ASSESSMENT AND PLAN:   Nonischemic cardiomyopathy:   The patient has class III symptoms.  He has a dilated cardiomyopathy.  Today I am going to increase his Imdur  to 60 mg daily.  However, with his symptoms of orthostasis and his renal insufficiency he is really been going to be on GDMT.   Given this I have suggested CRT D possibly.  We discussed this procedure and he just wants to consider this but will get back to us .  I offered him EP visit but he wants to defer for right now.   AI: This is moderate.  I am going to follow this clinically.  I again we will titrate the meds as above and have him consider device implantation.   Hyperlipidemia: LDL was 71 with an HDL of 46.  No change in therapy.   CKD stage III:  Creat was was 2.38.  I would like to repeat  a BMET.  His potassium was elevated at the last  visit.    Follow up with Hao Meng PAc in 3 months.    Signed, Lynwood Schilling, MD   "

## 2024-07-18 ENCOUNTER — Ambulatory Visit: Attending: Cardiology | Admitting: Cardiology

## 2024-07-18 ENCOUNTER — Encounter: Payer: Self-pay | Admitting: Cardiology

## 2024-07-18 VITALS — BP 150/60 | HR 72

## 2024-07-18 DIAGNOSIS — N1832 Chronic kidney disease, stage 3b: Secondary | ICD-10-CM | POA: Insufficient documentation

## 2024-07-18 DIAGNOSIS — E785 Hyperlipidemia, unspecified: Secondary | ICD-10-CM | POA: Insufficient documentation

## 2024-07-18 DIAGNOSIS — I428 Other cardiomyopathies: Secondary | ICD-10-CM | POA: Diagnosis not present

## 2024-07-18 MED ORDER — ISOSORBIDE MONONITRATE ER 60 MG PO TB24: 60.0000 mg | ORAL_TABLET | Freq: Every day | ORAL | 3 refills | Status: AC

## 2024-07-18 NOTE — Patient Instructions (Addendum)
 Medication Instructions:  Increase Isosorbide  mononitrate to 60 mg once daily *If you need a refill on your cardiac medications before your next appointment, please call your pharmacy*  Lab Work: BMET today at [facility] If you have labs (blood work) drawn today and your tests are completely normal, you will receive your results only by: MyChart Message (if you have MyChart) OR A paper copy in the mail If you have any lab test that is abnormal or we need to change your treatment, we will call you to review the results.  Testing/Procedures: NONE  Follow-Up: At [facility], you and your health needs are our priority.  As part of our continuing mission to provide you with exceptional heart care, our providers are all part of one team.  This team includes your primary Cardiologist (physician) and Advanced Practice Providers or APPs (Physician Assistants and Nurse Practitioners) who all work together to provide you with the care you need, when you need it.  Your next appointment:   2 month(s)  Provider:   Hao Meng, PA-C  We recommend signing up for the patient portal called MyChart.  Sign up information is provided on this After Visit Summary.  MyChart is used to connect with patients for Virtual Visits (Telemedicine).  Patients are able to view lab/test results, encounter notes, upcoming appointments, etc.  Non-urgent messages can be sent to your provider as well.   To learn more about what you can do with MyChart, go to [email or web address].

## 2024-09-19 ENCOUNTER — Ambulatory Visit: Admitting: Physician Assistant
